# Patient Record
Sex: Female | Born: 1955 | Race: White | Hispanic: No | Marital: Married | Smoking: Never smoker
Health system: Southern US, Community
[De-identification: ages and names within clinical notes are randomized; demographics above are authoritative.]

## PROBLEM LIST (undated history)

## (undated) DIAGNOSIS — E7800 Pure hypercholesterolemia, unspecified: Secondary | ICD-10-CM

## (undated) DIAGNOSIS — G4730 Sleep apnea, unspecified: Secondary | ICD-10-CM

## (undated) DIAGNOSIS — I10 Essential (primary) hypertension: Secondary | ICD-10-CM

## (undated) DIAGNOSIS — E119 Type 2 diabetes mellitus without complications: Secondary | ICD-10-CM

## (undated) DIAGNOSIS — K219 Gastro-esophageal reflux disease without esophagitis: Secondary | ICD-10-CM

## (undated) DIAGNOSIS — N946 Dysmenorrhea, unspecified: Secondary | ICD-10-CM

## (undated) DIAGNOSIS — M2550 Pain in unspecified joint: Secondary | ICD-10-CM

## (undated) DIAGNOSIS — A630 Anogenital (venereal) warts: Secondary | ICD-10-CM

## (undated) DIAGNOSIS — M1990 Unspecified osteoarthritis, unspecified site: Secondary | ICD-10-CM

## (undated) DIAGNOSIS — E079 Disorder of thyroid, unspecified: Secondary | ICD-10-CM

## (undated) DIAGNOSIS — Z8781 Personal history of (healed) traumatic fracture: Secondary | ICD-10-CM

## (undated) DIAGNOSIS — F419 Anxiety disorder, unspecified: Secondary | ICD-10-CM

## (undated) DIAGNOSIS — C801 Malignant (primary) neoplasm, unspecified: Secondary | ICD-10-CM

## (undated) DIAGNOSIS — F329 Major depressive disorder, single episode, unspecified: Secondary | ICD-10-CM

## (undated) DIAGNOSIS — R32 Unspecified urinary incontinence: Secondary | ICD-10-CM

## (undated) DIAGNOSIS — R87619 Unspecified abnormal cytological findings in specimens from cervix uteri: Secondary | ICD-10-CM

## (undated) DIAGNOSIS — F32A Depression, unspecified: Secondary | ICD-10-CM

## (undated) DIAGNOSIS — D649 Anemia, unspecified: Secondary | ICD-10-CM

## (undated) DIAGNOSIS — R7989 Other specified abnormal findings of blood chemistry: Secondary | ICD-10-CM

## (undated) HISTORY — PX: COLON RESECTION: SHX5231

## (undated) HISTORY — DX: Pure hypercholesterolemia, unspecified: E78.00

## (undated) HISTORY — PX: CARPAL TUNNEL RELEASE: SHX101

## (undated) HISTORY — DX: Type 2 diabetes mellitus without complications: E11.9

## (undated) HISTORY — DX: Unspecified abnormal cytological findings in specimens from cervix uteri: R87.619

## (undated) HISTORY — DX: Disorder of thyroid, unspecified: E07.9

## (undated) HISTORY — DX: Unspecified urinary incontinence: R32

## (undated) HISTORY — DX: Anogenital (venereal) warts: A63.0

## (undated) HISTORY — DX: Major depressive disorder, single episode, unspecified: F32.9

## (undated) HISTORY — PX: FOOT SURGERY: SHX648

## (undated) HISTORY — DX: Anxiety disorder, unspecified: F41.9

## (undated) HISTORY — DX: Malignant (primary) neoplasm, unspecified: C80.1

## (undated) HISTORY — DX: Essential (primary) hypertension: I10

## (undated) HISTORY — DX: Dysmenorrhea, unspecified: N94.6

## (undated) HISTORY — DX: Personal history of (healed) traumatic fracture: Z87.81

## (undated) HISTORY — DX: Anemia, unspecified: D64.9

## (undated) HISTORY — PX: LASIK: SHX215

## (undated) HISTORY — PX: OTHER SURGICAL HISTORY: SHX169

## (undated) HISTORY — DX: Gastro-esophageal reflux disease without esophagitis: K21.9

## (undated) HISTORY — DX: Depression, unspecified: F32.A

## (undated) HISTORY — PX: SHOULDER SURGERY: SHX246

## (undated) HISTORY — PX: BUNIONECTOMY: SHX129

## (undated) HISTORY — DX: Other specified abnormal findings of blood chemistry: R79.89

## (undated) HISTORY — DX: Sleep apnea, unspecified: G47.30

## (undated) HISTORY — DX: Pain in unspecified joint: M25.50

## (undated) HISTORY — PX: TUBAL LIGATION: SHX77

## (undated) HISTORY — DX: Unspecified osteoarthritis, unspecified site: M19.90

---
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
## 1988-04-17 HISTORY — PX: APPENDECTOMY: SHX54

## 1998-02-03 ENCOUNTER — Ambulatory Visit (HOSPITAL_COMMUNITY): Admission: RE | Admit: 1998-02-03 | Discharge: 1998-02-03 | Payer: Self-pay | Admitting: Family Medicine

## 2000-04-25 ENCOUNTER — Ambulatory Visit (HOSPITAL_COMMUNITY): Admission: RE | Admit: 2000-04-25 | Discharge: 2000-04-25 | Payer: Self-pay | Admitting: Family Medicine

## 2000-04-25 ENCOUNTER — Encounter: Payer: Self-pay | Admitting: Family Medicine

## 2001-09-25 ENCOUNTER — Ambulatory Visit (HOSPITAL_COMMUNITY): Admission: RE | Admit: 2001-09-25 | Discharge: 2001-09-25 | Payer: Self-pay | Admitting: Family Medicine

## 2001-09-25 ENCOUNTER — Encounter: Payer: Self-pay | Admitting: Family Medicine

## 2003-02-06 ENCOUNTER — Encounter (INDEPENDENT_AMBULATORY_CARE_PROVIDER_SITE_OTHER): Payer: Self-pay | Admitting: *Deleted

## 2003-02-06 ENCOUNTER — Ambulatory Visit (HOSPITAL_BASED_OUTPATIENT_CLINIC_OR_DEPARTMENT_OTHER): Admission: RE | Admit: 2003-02-06 | Discharge: 2003-02-06 | Payer: Self-pay | Admitting: Plastic Surgery

## 2003-02-06 ENCOUNTER — Ambulatory Visit (HOSPITAL_COMMUNITY): Admission: RE | Admit: 2003-02-06 | Discharge: 2003-02-06 | Payer: Self-pay | Admitting: Plastic Surgery

## 2003-05-11 ENCOUNTER — Ambulatory Visit (HOSPITAL_COMMUNITY): Admission: RE | Admit: 2003-05-11 | Discharge: 2003-05-11 | Payer: Self-pay | Admitting: Family Medicine

## 2003-05-22 ENCOUNTER — Ambulatory Visit (HOSPITAL_COMMUNITY): Admission: RE | Admit: 2003-05-22 | Discharge: 2003-05-22 | Payer: Self-pay | Admitting: Family Medicine

## 2003-11-30 ENCOUNTER — Other Ambulatory Visit: Admission: RE | Admit: 2003-11-30 | Discharge: 2003-11-30 | Payer: Self-pay | Admitting: Family Medicine

## 2003-12-15 ENCOUNTER — Ambulatory Visit (HOSPITAL_COMMUNITY): Admission: RE | Admit: 2003-12-15 | Discharge: 2003-12-15 | Payer: Self-pay | Admitting: Family Medicine

## 2003-12-24 ENCOUNTER — Encounter: Payer: Self-pay | Admitting: Pulmonary Disease

## 2003-12-24 ENCOUNTER — Ambulatory Visit (HOSPITAL_BASED_OUTPATIENT_CLINIC_OR_DEPARTMENT_OTHER): Admission: RE | Admit: 2003-12-24 | Discharge: 2003-12-24 | Payer: Self-pay | Admitting: Family Medicine

## 2004-02-02 ENCOUNTER — Encounter: Payer: Self-pay | Admitting: Pulmonary Disease

## 2004-05-09 ENCOUNTER — Ambulatory Visit: Payer: Self-pay | Admitting: Pulmonary Disease

## 2004-12-11 ENCOUNTER — Inpatient Hospital Stay (HOSPITAL_COMMUNITY): Admission: EM | Admit: 2004-12-11 | Discharge: 2004-12-16 | Payer: Self-pay | Admitting: *Deleted

## 2005-01-13 ENCOUNTER — Ambulatory Visit (HOSPITAL_COMMUNITY): Admission: RE | Admit: 2005-01-13 | Discharge: 2005-01-13 | Payer: Self-pay | Admitting: Family Medicine

## 2005-01-16 ENCOUNTER — Other Ambulatory Visit: Admission: RE | Admit: 2005-01-16 | Discharge: 2005-01-16 | Payer: Self-pay | Admitting: Family Medicine

## 2005-02-24 ENCOUNTER — Ambulatory Visit (HOSPITAL_COMMUNITY): Admission: RE | Admit: 2005-02-24 | Discharge: 2005-02-24 | Payer: Self-pay | Admitting: Family Medicine

## 2005-04-04 ENCOUNTER — Encounter: Admission: RE | Admit: 2005-04-04 | Discharge: 2005-04-04 | Payer: Self-pay | Admitting: Otolaryngology

## 2005-05-25 ENCOUNTER — Encounter (INDEPENDENT_AMBULATORY_CARE_PROVIDER_SITE_OTHER): Payer: Self-pay | Admitting: Specialist

## 2005-05-25 ENCOUNTER — Inpatient Hospital Stay (HOSPITAL_COMMUNITY): Admission: RE | Admit: 2005-05-25 | Discharge: 2005-06-01 | Payer: Self-pay | Admitting: Surgery

## 2006-01-24 ENCOUNTER — Ambulatory Visit: Payer: Self-pay | Admitting: Pulmonary Disease

## 2006-04-26 ENCOUNTER — Other Ambulatory Visit: Admission: RE | Admit: 2006-04-26 | Discharge: 2006-04-26 | Payer: Self-pay | Admitting: Family Medicine

## 2006-05-07 ENCOUNTER — Ambulatory Visit (HOSPITAL_COMMUNITY): Admission: RE | Admit: 2006-05-07 | Discharge: 2006-05-07 | Payer: Self-pay | Admitting: Family Medicine

## 2006-08-03 ENCOUNTER — Ambulatory Visit (HOSPITAL_COMMUNITY): Admission: RE | Admit: 2006-08-03 | Discharge: 2006-08-04 | Payer: Self-pay | Admitting: Specialist

## 2007-05-24 ENCOUNTER — Ambulatory Visit (HOSPITAL_COMMUNITY): Admission: RE | Admit: 2007-05-24 | Discharge: 2007-05-24 | Payer: Self-pay | Admitting: Family Medicine

## 2007-05-29 ENCOUNTER — Other Ambulatory Visit: Admission: RE | Admit: 2007-05-29 | Discharge: 2007-05-29 | Payer: Self-pay | Admitting: Family Medicine

## 2008-06-29 ENCOUNTER — Ambulatory Visit (HOSPITAL_COMMUNITY): Admission: RE | Admit: 2008-06-29 | Discharge: 2008-06-29 | Payer: Self-pay | Admitting: Family Medicine

## 2009-08-09 ENCOUNTER — Encounter: Payer: Self-pay | Admitting: Pulmonary Disease

## 2009-09-28 ENCOUNTER — Other Ambulatory Visit: Admission: RE | Admit: 2009-09-28 | Discharge: 2009-09-28 | Payer: Self-pay | Admitting: Family Medicine

## 2009-10-11 ENCOUNTER — Encounter: Payer: Self-pay | Admitting: Pulmonary Disease

## 2009-11-03 DIAGNOSIS — E039 Hypothyroidism, unspecified: Secondary | ICD-10-CM | POA: Insufficient documentation

## 2009-11-03 DIAGNOSIS — G4733 Obstructive sleep apnea (adult) (pediatric): Secondary | ICD-10-CM | POA: Insufficient documentation

## 2009-11-04 ENCOUNTER — Ambulatory Visit: Payer: Self-pay | Admitting: Pulmonary Disease

## 2009-11-04 DIAGNOSIS — E785 Hyperlipidemia, unspecified: Secondary | ICD-10-CM | POA: Insufficient documentation

## 2009-11-04 DIAGNOSIS — I10 Essential (primary) hypertension: Secondary | ICD-10-CM | POA: Insufficient documentation

## 2009-12-15 ENCOUNTER — Telehealth: Payer: Self-pay | Admitting: Pulmonary Disease

## 2009-12-17 ENCOUNTER — Encounter: Payer: Self-pay | Admitting: Pulmonary Disease

## 2010-01-27 ENCOUNTER — Ambulatory Visit (HOSPITAL_BASED_OUTPATIENT_CLINIC_OR_DEPARTMENT_OTHER): Admission: RE | Admit: 2010-01-27 | Discharge: 2010-01-27 | Payer: Self-pay | Admitting: Plastic Surgery

## 2010-03-02 ENCOUNTER — Ambulatory Visit (HOSPITAL_COMMUNITY): Admission: RE | Admit: 2010-03-02 | Discharge: 2010-03-02 | Payer: Self-pay | Admitting: Family Medicine

## 2010-05-08 ENCOUNTER — Encounter: Payer: Self-pay | Admitting: Family Medicine

## 2010-05-19 NOTE — Consult Note (Signed)
Summary: [facility]   Imported By: Sherian Rein 11/05/2009 07:46:45  _____________________________________________________________________  External Attachment:    Type:   Image     Comment:   External Document

## 2010-05-19 NOTE — Miscellaneous (Signed)
Summary: auto shows optimal pressure of 17cm  Clinical Lists Changes  Orders: Added new Referral order of DME Referral (DME) - Signed auto shows great compliance, no leaks, and optimal pressure 17cm will send an order to dme

## 2010-05-19 NOTE — Progress Notes (Signed)
Summary: waiting for order  Phone Note Call from Patient Call back at Home Phone [phone]   Caller: Patient Call For: Johannes Everage Summary of Call: pt says that [facility] is waiting for an order. she doesn't know what for, but it has something to do with cpap. pt has already done the 2 wk auto.  Initial call taken by: Tivis Ringer, CNA,  December 15, 2009 4:04 PM  Follow-up for Phone Call        megan see if the download in in the folder Follow-up by: Oneita Jolly,  December 15, 2009 4:26 PM  Additional Follow-up for Phone Call Additional follow up Details #1::        received down load and CMN from [facility] and put in Kc's very important look at folder for him to review.  Aundra Millet Reynolds LPN  December 16, 2009 4:57 PM     Additional Follow-up for Phone Call Additional follow up Details #2::    see clinical list update Follow-up by: Barbaraann Share MD,  December 17, 2009 11:07 AM

## 2010-05-19 NOTE — Letter (Signed)
Summary: Denial of LMN/[facility]  Denial of LMN/[facility]   Imported By: Lester Mamou 08/13/2009 09:35:07  _____________________________________________________________________  External Attachment:    Type:   Image     Comment:   External Document

## 2010-05-19 NOTE — Letter (Signed)
Summary: DENIED CMN/[facility]  DENIED CMN/[facility]   Imported By: Lester Mamou 10/15/2009 09:34:56  _____________________________________________________________________  External Attachment:    Type:   Image     Comment:   External Document

## 2010-05-19 NOTE — Assessment & Plan Note (Signed)
Summary: consult for management of osa   Primary Provider/Referring Provider:  Nicholos Johns  CC:  Sleep Consult. Former pt.  Last seen by Dr. Shelle Iron Oct 2007.Marland Kitchen  History of Present Illness: the pt is a 55 y/o female who I have been asked to see for management of osa.  She was diagnosed with mild osa in 2005, with AHI of 11/hr, along with large numbers of PLMS with significant sleep disruption.  She was tried on a dopamine agonist first, and had issues with tolerance.  She was then started on cpap, with optimal pressure 14cm, and did well with this therapy.  She was last seen in 2007.  She comes in today where she is not sleeping as well as she has in the past.  She is wearing cpap compliantly, and has been keeping up with the mask changes.  She currently is using a full face mask.  She goes to bed btw 9-10pm, and arises at 4am to start her day.  She has very restless sleep, and does not feel rested in the am's upon arising.  She does not think she is kicking at night.  Her alertness typically declines as the day goes on, and she will need to take a nap in the late afternoon at times.  She can doze in the evening while watching tv.  Her weight is neutral from the last evaluation, and her epworth score today is 8.  Current Medications (verified): 1)  Zoloft 100 Mg Tabs (Sertraline Hcl) .... Take 1 Tablet By Mouth Once A Day 2)  Omeprazole 20 Mg Cpdr (Omeprazole) .... Take 1 Tablet By Mouth Once A Day 3)  Lisinopril 20 Mg Tabs (Lisinopril) .... Take 1 Tablet By Mouth Once A Day 4)  Pravastatin Sodium 40 Mg Tabs (Pravastatin Sodium) .... Take 1 Tablet By Mouth Two Times A Day  Allergies (verified): No Known Drug Allergies  Past History:  Past Medical History:  HYPERTENSION (ICD-401.9) HYPERLIPIDEMIA (ICD-272.4) OBSTRUCTIVE SLEEP APNEA (ICD-327.23)--AHI 11/hr HYPOTHYROIDISM (ICD-244.9) PLMS on npsg....4/hr with arousal or awakening.  Past Surgical History: appendectomy  1990 tubal ligation   1988 nasal septal reconstruction colon surgery 2006  Family History: Reviewed history and no changes required. heart disease: mother   Social History: Reviewed history from 11/03/2009 and no changes required. Pt is an Environmental health practitioner for [employer].   Pt is married. Pt has children. Pt has never smoked.   Review of Systems       The patient complains of shortness of breath with activity and depression.  The patient denies shortness of breath at rest, productive cough, non-productive cough, coughing up blood, chest pain, irregular heartbeats, acid heartburn, indigestion, loss of appetite, weight change, abdominal pain, difficulty swallowing, sore throat, tooth/dental problems, headaches, nasal congestion/difficulty breathing through nose, sneezing, itching, ear ache, anxiety, hand/feet swelling, joint stiffness or pain, rash, change in color of mucus, and fever.    Vital Signs:  Patient profile:   55 year old female Height:      63 inches Weight:      187.50 pounds BMI:     33.33 O2 Sat:      95 % on Room air Temp:     97.3 degrees F oral Pulse rate:   75 / minute BP sitting:   118 / 70  (left arm) Cuff size:   regular  Vitals Entered By: Arman Filter LPN (November 04, 2009 2:07 PM)  O2 Flow:  Room air CC: Sleep Consult. Former pt.  Last seen  by Dr. Shelle Iron Oct 2007. Comments Medications reviewed with patient Arman Filter LPN  November 04, 2009 2:07 PM    Physical Exam  General:  ow female in nad Eyes:  PERRLA and EOMI.   Nose:  deviated septum to right with narrowing.  Left patent. Mouth:  elongation of soft palate and uvula Neck:  no jvd, tmg, LN Lungs:  clear to auscultation Heart:  rrr, no mrg Abdomen:  soft and nontender, bs + Extremities:  no edema noted, pulses intact distally no cyanosis Neurologic:  alert and oriented, moves all 4.   Impression & Recommendations:  Problem # 1:  OBSTRUCTIVE SLEEP APNEA (ICD-327.23) the pt has known osa for  which she is wearing cpap compliantly.  Her download today verifies this.  Will have her machine checked for issues, and relook at pressure needs.  If she continues to have nonrestorative sleep and daytime sleepiness issues, may have to consider if her PLMS represent a primary movement disorder of sleep?  I have also encouraged her to work aggressively on weight loss.    Medications Added to Medication List This Visit: 1)  Omeprazole 20 Mg Cpdr (Omeprazole) .... Take 1 tablet by mouth once a day 2)  Lisinopril 20 Mg Tabs (Lisinopril) .... Take 1 tablet by mouth once a day 3)  Pravastatin Sodium 40 Mg Tabs (Pravastatin sodium) .... Take 1 tablet by mouth two times a day  Other Orders: New Patient Level IV (69629) DME Referral (DME)  Patient Instructions: 1)  will get [facility] to check your machine's functioning 2)  will use auto device for 2 weeks to re-optimize your pressure.  I will let you know the results. 3)  work on weight loss 4)  followup with me in one year, but call if having issues.  Appended Document: consult for management of osa Almyra Free, we had this pt put on an auto for 2 weeks with download to follow, but I am wondering if she is on an auto already.  Please find out for me...but dme still needs to check machine functioning.

## 2010-06-29 LAB — POCT I-STAT, CHEM 8
BUN: 12 mg/dL (ref 6–23)
Calcium, Ion: 1.23 mmol/L (ref 1.12–1.32)
Chloride: 101 mEq/L (ref 96–112)
Creatinine, Ser: 0.9 mg/dL (ref 0.4–1.2)
Glucose, Bld: 109 mg/dL — ABNORMAL HIGH (ref 70–99)
HCT: 41 % (ref 36.0–46.0)
Hemoglobin: 13.9 g/dL (ref 12.0–15.0)
Potassium: 4 mEq/L (ref 3.5–5.1)
Sodium: 136 mEq/L (ref 135–145)
TCO2: 27 mmol/L (ref 0–100)

## 2010-06-29 LAB — POCT HEMOGLOBIN-HEMACUE: Hemoglobin: 12.6 g/dL (ref 12.0–15.0)

## 2010-09-02 NOTE — Procedures (Signed)
NAME:  Mallory Carter, Mallory Carter              ACCOUNT NO.:  [id number]   MEDICAL RECORD NO.:  [id number]          PATIENT TYPE:  OUT   LOCATION:  SLEEP CENTER                 FACILITY:  [facility]   PHYSICIAN:  Clinton D. Maple Hudson, M.D. DATE OF BIRTH:  03-Jul-1955   DATE OF ADMISSION:  12/24/2003  DATE OF DISCHARGE:  12/24/2003                              NOCTURNAL POLYSOMNOGRAM   REFERRING PHYSICIAN:  Dr. Francesca Oman   INDICATION FOR STUDY:  Insomnia with sleep apnea.   EPWORTH SLEEPINESS SCORE:  12/24   BMI:  31.8.   WEIGHT:  180 pounds   MEDICATION LIST:  Zoloft, Synthroid, aspirin, multivitamin.   SLEEP ARCHITECTURE:  Total sleep time 388 minutes with sleep efficiency 97%.  Stage I was 4%, Stage II 65%, Stages III and IV were 2%, REM was 29% of  total sleep time. Sleep latency was 9.5 minutes, REM latency was 122  minutes, awake after sleep onset 3 minutes, arousal index 26.   RESPIRATORY DATA:  RDI 11/hr indicating mild obstructive sleep  apnea/hypopnea syndrome with 62 obstructive hypopneas and 6 obstructive  apneas.  Events were not positional. REM RDI 23/hr. Split study protocol could not be  performed because of insufficient events early enough to allow for CPAP  titration.  Most events clustered late in the night.   There were frequent nonspecific arousals.   OXYGEN DATA:  Moderate snoring, occasionally loud with mild oxygen  desaturation to 87%. Mean oxygen saturation on room air through the study  was 95%.   CARDIAC DATA:  Normal sinus rhythm.   MOVEMENT/PARASOMNIA:  A total of 100 limb jerks were recorded of which 26  were associated with arousal or awakening for a periodic limb movement with  arousal index of 4/hr.  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  Additional movements were noted during REM.  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  Is there a  family history of Parkinson's disease?   IMPRESSION/RECOMMENDATION:  Mild obstructive sleep apnea/hypopnea syndrome,  respiratory disturbance index 11/hr with mild oxygen desaturation.  Consider  return for CPAP titration or evaluation for alternative therapies if  appropriate.  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  This might be  treated with clonazepam or Requip if appropriate.  Occasional limb jerks  noted during REM, which is abnormal, but of uncertain clinical significance  in this case.  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   Frequent nonspecific EEG arousals noted early in the study may contribute to  subjective poor sleep quality/insomnia complaints and might justify  including treatment for insomnia in the clinical plan.                                   ______________________________                                Rennis Chris. Maple Hudson, M.D.  Diplomate, Biomedical engineer of Sleep Medicine    CDY/MEDQ  D:  12/26/2003 10:56:45  T:  12/26/2003 18:22:56  Job:  [id number]

## 2010-09-02 NOTE — Discharge Summary (Signed)
NAMEANEYAH, LORTZ                ACCOUNT NO.:  [id number]   MEDICAL RECORD NO.:  [id number]          PATIENT TYPE:  INP   LOCATION:  5727                         FACILITY:  [facility]   PHYSICIAN:  Kela Millin, M.D.DATE OF BIRTH:  02-23-1956   DATE OF ADMISSION:  12/11/2004  DATE OF DISCHARGE:  12/16/2004                                 DISCHARGE SUMMARY   DISCHARGE DIAGNOSES:  1.  Sigmoid diverticulitis.  2.  History of depression.  3  History of hypothyroidism, no medications.   CONSULTATIONS:  Surgery.  Dr. Ezzard Standing.   STUDIES:  CT scan of the abdomen and pelvis:  Findings consistent with acute  sigmoid diverticulitis.  May be a contained perforation, but no visible  abscess.   HISTORY:  The patient is a 55 year old white female who presented with  complaints of nausea, anorexia and severe left lower quadrant cramping  abdominal pain.  She reported that she initially attributed the pain to gas  and took Ex-Lax, which precipitated diarrhea but did not relieve her pain.  She stated that the pain persisted over the next 48 hours and so she came to  the ER.  She denied fevers, also denied chills and no vomiting.  She stated  that she had passed flatus and had had loose stools but denied hematemesis  and no melena.  The patient was seen in the ER and a CT scan of the abdomen  showed evidence of sigmoid diverticulitis, and she was admitted to the [facility]  hospitalist service for further evaluation and management.   Her physical exam upon admission as per Dr. Tresa Endo revealed a temperature  of 97.2 with a blood pressure of 154/85, pulse of 88, respiratory rate 12,  O2 saturation of 98%, and the pertinent findings on exam were on her  abdomen, she had tenderness to palpation in the left lower quadrant without  guarding or rebound, and slight distension was noted.  The rest of her  physical exam was noted to be within normal limits, and other laboratory  data included a sodium of 136,  with a potassium of 4.3, glucose 118,  creatinine 0.6.  Urinalysis negative.  Her white cell count 11, hemoglobin  of 13.2.   HOSPITAL COURSE:  Problem 1.  SIGMOID DIVERTICULITIS:  Upon admission, the patient was started  on empiric antibiotics, Cipro and Flagyl, and also IV analgesics for pain  control.  Surgery was also consulted, and Dr. Ezzard Standing saw the patient and  agreed with conservative management.  She was kept n.p.o. and hydrated with  IV fluids.  Her abdominal pain gradually resolved and she was subsequently  started on a clear liquid diet, which she tolerated.  Today she was seen by  surgery, Dr. Ezzard Standing, and her diet advanced to low-fiber, low-residual diet.  She has tolerated all her meals today with no nausea and vomiting and no  abdominal pain and is requesting to go home at this time.  Dr. Ezzard Standing  recommended following up with him in six weeks.  The patient's last white  cell count is 6.9.  She has remained afebrile  and hemodynamically stable,  and so I think that it is reasonable to discharge her at this time to follow  up with her primary care physician and Dr. Ezzard Standing.   Problem 2.  HISTORY OF DEPRESSION:  Patient to continue on Zoloft.   DISCHARGE MEDICATIONS:  1.  Cipro 500 mg one p.o. b.i.d. x9 more days.  2.  Flagyl 500 mg one p.o. t.i.d. x9  more days.  3.  Vicodin one to two p.o. q.4-6h. p.r.n.  4.  Patient to continue preadmission medications.   FOLLOW-UP CARE:  1.  Dr. Ezzard Standing in six weeks as scheduled.  2.  Primary care physician, Dr. Nicholos Johns, in one week.   DISCHARGE CONDITION:  Improved/stable.   DISCHARGE DIET:  Low-fiber/low-residual diet, no nuts.  Patient encouraged  to gradually increase fiber intake and diet after her symptoms are  completely resolved.           ______________________________  Kela Millin, M.D.     ACV/MEDQ  D:  12/16/2004  T:  12/17/2004  Job:  [id number]   cc:   Molly Maduro A. Nicholos Johns, M.D.  Fax: [phone]   Sandria Bales. Ezzard Standing,  M.D.  [address], [address]  [place]  [place] [address]

## 2010-09-02 NOTE — Discharge Summary (Signed)
NAMEBERYL, HORNBERGER NO.:  [id number]   MEDICAL RECORD NO.:  [id number]          PATIENT TYPE:  INP   LOCATION:  5713                         FACILITY:  [facility]   PHYSICIAN:  Sandria Bales. Ezzard Standing, M.D.  DATE OF BIRTH:  [birth year]   DATE OF ADMISSION:  05/25/2005  DATE OF DISCHARGE:  06/01/2005                                 DISCHARGE SUMMARY   DISCHARGE DIAGNOSES:  1.  Sigmoid colon diverticulitis.  2.  History of depression.  3.  Corrected hypothyroidism.   OPERATIONS PERFORMED:  Patient had a laparoscopic-assisted sigmoid colectomy  on May 25, 2005.   HISTORY OF ILLNESS:  Ms. Hiltunen is a 55 year old white female who is a  patient of Dr. Elias Else who was hospitalized at [facility] at  the end of August 2006 for diverticulitis.  She responded well to  antibiotics, but since going home has had repeated episodes of  diverticulitis requiring antibiotics.  She has now come for this  hospitalization for sigmoid colectomy to treat her recurrent diverticulitis.   The patient also has a history of depression for which she is on Zoloft and  she has history, I think, of hypothyroidism.   The patient had a mechanical and antibiotic bowel prep the day before  surgery and then presented to [facility] on February 8 where she  underwent a laparoscopic-assisted sigmoid colectomy.   Postoperative she did well.  On her first postoperative day she did have an  NG tube.  Her hemoglobin was 12, her white blood count 13,900.  Her sodium  132, potassium 3.8, chloride of 0.8.   By the second postoperative day I removed her NG tube.  I started her on  clear liquids on the sixth postoperative day after having a couple of small  bowel movements.  She is now seven days postoperative.  She is afebrile.  She is taking a regular diet.  Her wound looks good and she is ready for  discharge.   DISCHARGE INSTRUCTIONS:  Vicodin for pain.  She will resume her Zoloft that  she was on at home 100 mg daily, Zantac 100 mg daily, multivitamin.  We will  remove her staples at the time of discharge and Steri-Strip her wound and  she will see me back in the office in two to three weeks.   Her final pathology on the sigmoid colon showed segment of sigmoid colon  with diverticulitis and peridiverticulitis and four benign lymph nodes.      Sandria Bales. Ezzard Standing, M.D.  Electronically Signed     DHN/MEDQ  D:  06/01/2005  T:  06/01/2005  Job:  [id number]   cc:   Molly Maduro A. Nicholos Johns, M.D.  Fax: [phone]

## 2010-09-02 NOTE — Consult Note (Signed)
NAMEHALLEY, SHEPHEARD NO.:  [id number]   MEDICAL RECORD NO.:  [id number]          PATIENT TYPE:  INP   LOCATION:  5727                         FACILITY:  [facility]   PHYSICIAN:  Sandria Bales. Ezzard Standing, M.D.  DATE OF BIRTH:  1956-03-18   DATE OF CONSULTATION:  12/12/2004  DATE OF DISCHARGE:                                   CONSULTATION   REASON FOR CONSULTATION:  Diverticulitis.   Mallory Carter is a 55 year old female who complains of lower abdominal pain  beginning yesterday afternoon.  This was associated with nausea as well as  abdominal cramping.  She denies any fevers, chills, shortness of breath, or  chest pain.  Review of systems is otherwise negative.  She presented to the  emergency room where CT of the abdomen/pelvis confirmed an acute sigmoid  diverticulitis with no abscess.  She was admitted and kept n.p.o., IV fluid  hydration, IV Cipro, IV Flagyl as well as IV pain control.   ALLERGIES:  ERYTHROMYCIN.   MEDICATIONS:  IV Cipro, IV Flagyl, morphine for pain.   SOCIAL HISTORY:  She is married, lives in [place].  She has two  daughters.  She did have a third child but this child died secondary to an  MVA.  She denies any tobacco, alcohol, or illicit drug use.   FAMILY HISTORY:  Mom died with heart and lung disease.  Father is alive and  well.   PAST MEDICAL HISTORY:  1.  Depression.  2.  She also has a history of hypothyroidism but she is not on medical      therapy for at this time.  3.  She has had multiple surgeries including appendectomy, foot surgery,      sinus surgery, and shoulder surgery.   LABORATORY STUDIES:  On admission her white count was 11,100.  It has  climbed to 15,600.  Hemoglobin 11.6, hematocrit 32.8.  BMET is essentially  normal.  Her LFTs are normal.  Amylase and lipase are unremarkable.  TSH  2.324.  Urinalysis negative.   PHYSICAL EXAMINATION:  VITAL SIGNS:  Temperature 99.9, pulse 100,  respirations 20, blood pressure 123/80,  O2 saturations 97% on room air.  HEENT:  Grossly normal.  No carotid or subclavian bruits.  No JVD or  thyromegaly.  Sclerae clear.  Conjunctivae normal.  Nares without discharge.  No jaundice.  CHEST:  Clear to auscultation bilaterally.  No wheezing or rhonchi.  HEART:  Regular rate and rhythm.  No gallops, murmurs, rubs, or ectopy.  ABDOMEN:  Bilateral lower quadrants are tender.  There is no guarding.  There is good bowel sounds.  NEUROLOGIC:  Cranial nerves II-XII grossly intact.  SKIN:  Warm and dry.  EXTREMITIES:  Lower extremities reveal no peripheral edema.  Warm and well  perfused.   ASSESSMENT/PLAN:  1.  Sigmoid diverticulitis.  No evidence of perforation or abscess by CT      scan.  The diverticulitis is somewhat more distal than usual      diverticulitis.  2.  History of depression.  3.  Hypothyroidism.  4.  History of  appendectomy.  5.  History of foot surgery.  6.  History of sinus surgery.  7.  History of shoulder surgery.  8.  Allergy to erythromycin.   At this point we will continue the patient's current medical regimen  including IV Cipro, IV Flagyl, pain control, IV fluids, and keeping her  n.p.o.  Over the next several days we hope that she will continue to  improve.  This seems like the first diverticulitis attack that she has had.  When this episode has resolved, it would be appropriate to obtain a BE in 8  to 12 weeks to document the amount of diverticular disease she has.  If she  does not progress or worsens we may need to contemplate surgery sooner,  though at this point we hope a nonsurgical course will remain our plan.      Mallory Carter, P.A.      Sandria Bales. Ezzard Standing, M.D.  Electronically Signed    LB/MEDQ  D:  12/12/2004  T:  12/12/2004  Job:  [id number]   cc:   Sherin Quarry, MD   Meredith Staggers, M.D.  [address]  [place]  [place] [address]  Fax: [phone]

## 2010-09-02 NOTE — Op Note (Signed)
Mallory Carter, DAVILLA NO.:  [id number]   MEDICAL RECORD NO.:  [id number]          PATIENT TYPE:  OIB   LOCATION:  1513                         FACILITY:  [facility]   PHYSICIAN:  Erasmo Leventhal, M.D.DATE OF BIRTH:  1956/03/05   DATE OF PROCEDURE:  08/03/2006  DATE OF DISCHARGE:                               OPERATIVE REPORT   PREOPERATIVE DIAGNOSIS:  Right shoulder calcific tendinitis.   POSTOPERATIVE DIAGNOSIS:  Right shoulder calcific tendinitis.   PROCEDURE:  1. Right shoulder exam under anesthesia.  2. Arthroscopic subacromial decompression.  3. Acromioplasty.  4. Bursectomy.  5. __________ ligament release.  6. Arthroscopic debridement of calcific tendinitis.  7. Glenohumeral diagnostic arthroscopy.   SURGEON:  Eugenia Mcalpine, M.D.   ASSISTANT:  Vertell Novak, P.A.C.   ANESTHESIA:  1. Interscalene block.  2. General.   ESTIMATED BLOOD LOSS:  Less than 10 mL.   DRAINS:  None.   COMPLICATIONS:  None.   DISPOSITION:  PACU stable.   OPERATIVE DETAILS:  Patient and family counseled in the holding area.  An IV was started and chart was signed appropriately.  Interscalene  block was administered.  Taken to the operating room, placed in the  supine position, given general anesthesia and turned into a left lateral  decubitus position, properly padded and bumped, right shoulder being  examined, full range of motion stable, prepped with DuraPrep, draped in  a sterile fashion, over head shoulder position utilizing 30 degrees of  abduction, 10 degrees of forward flexion and 10 pounds longitudinal  traction.  A posterior portal was created into a small stab wound.  An  arthroscope was gently placed into the glenohumeral joint.  Diagnostic  arthroscopy revealed normal synovium, capsule, biceps, labrum, articular  cartilage, glenohumeral ligaments with normal interarticular anatomy.  __________ .   Subacromial region revealed a thick subacromial  bursa, a lateral port  was established, a subacromial bursectomy was performed.  She had a mild  type 2 acromion, an ArthroCare system was utilized to release the  periosteum __________ ligament and scarring from the previous time.  A  bur was then placed posteriorly, the anterior-inferior acromioplasty was  performed, converting it to a flat acromial morphology, AC joint was  left alone and not violated.  At this point in time with needle  localization the calcific deposits could be localized in the anterior  supraspinatus and upper subscapularis.  Based upon that, this was then  unroofed and debrided.  I removed as much as possible, trying not to  destabilize the tendon.  There were some calcific deposits imbedded into  tendon itself which would require basically excising the tendon.  I felt  this was inappropriate.  This was well debrided back, copiously  irrigated to remove the calcific debris.  We had a well decompressed  calcific area with nice sturdy rotator cuff tissue available without any  significant rent.  I again irrigated, reinspected, no other  abnormalities noted and arthroscopic clips removed.  The two portals  were closed with one suture.  Another 15 mL of 12% Marcaine with  epinephrine placed to  the portal site and subacromial region for pain.  We confirmed this with Anesthesiology.  She was taken out of traction.  She had normal pulse in hand at the end of the case.  She was placed  into a sling, turned supine awake and taken from the operating room to  the PACU in stable condition.  Sponge and needle count correct, no  complications or problems.   To help with __________ , Mr. Halford Decamp, P.A.-C., assistance was  needed   PLAN:  She will be kept over night due to her sleep apnea, monitored  closely.  She will be discharged home tomorrow.  She will be followed  back in the office next week for x-rays and start physiotherapy.            ______________________________  Erasmo Leventhal, M.D.     RAC/MEDQ  D:  08/03/2006  T:  08/03/2006  Job:  [id number]

## 2010-09-02 NOTE — H&P (Signed)
NAMECHERYLEE, RAWLINSON NO.:  [id number]   MEDICAL RECORD NO.:  [id number]          PATIENT TYPE:  EMS   LOCATION:  MAJO                         FACILITY:  [facility]   PHYSICIAN:  Sherin Quarry, MD      DATE OF BIRTH:  [birth year]   DATE OF ADMISSION:  12/11/2004  DATE OF DISCHARGE:                                HISTORY & PHYSICAL   PRIMARY CARE PHYSICIAN:  Meredith Staggers, M.D., [facility].   Mallory Carter is a 55 year old lady who was in good health until Thursday of  this week when she noted the onset of nausea, anorexia, and severe left  lower quadrant cramping abdominal pain. She initially attributed the pain to  gas and took Ex-Lax which caused her to have diarrhea but did not relieve  the pain.  The pain persisted over the next 48 hours and today; i.e.,  Sunday,  the pain has been worse. She states that she does not think that  she has had any fever. There is been no chills or sweats. No breathing  difficulty, no vomiting. She has passed flatus. Stools have been loose.  There has been no hematemesis or melena. She has not had any previous  examinations of her colon. She presented to the [facility] Emergency Room  where her temperature was 97.2. She was sent for a CT scan of the abdomen  which shows evidence of sigmoid diverticulitis. She is, therefore, admitted  at this time for further treatment of this problem.   PAST MEDICAL HISTORY:   MEDICATIONS:  Consist of Zoloft 100 mg daily and vitamins.   ALLERGIES:  She is allergic to ERYTHROMYCIN.   OPERATIONS:  1.  She has had two previous C-sections.  2.  Appendectomy in 1990.  3.  A sinus operation.  4.  Shoulder operation.  5.  Foot operation.   MEDICAL ILLNESSES:  1.  The patient was treated for pneumonia last year approximately in      December. This was managed as an outpatient with oral antibiotics. The      patient recovered uneventfully.  2.  The patient is currently receiving  treatment for depression with Zoloft.  3.  The patient was diagnosed with hypothyroidism 1 year ago. She was taking      Synthroid consistently until 1 month ago when she spontaneously stopped      taking this medication. She is not sure why she did this. She has not      had any recent problems with heat or cold intolerance, skin or hair      changes.   FAMILY HISTORY:  She has three siblings who are in good health; 1 sibling  died in a motor vehicle accident. Her mother died of heart and lung problems  in 74. Her father is said to be in excellent health.   SOCIAL HISTORY:  She does not smoke. She denies abuse of alcohol. She is  accompanied by her family. She states that she does not take any herbal  medicines or over-the-counter medications except for an occasional aspirin  REVIEW OF SYSTEMS:  HEAD:  She denies headache or dizziness. EYES:  She  denies visual blurring or diplopia. NOSE AND THROAT: Denies earache, sinus  pain or sore throat. CHEST:  Denies coughing, wheezing, or chest congestion.  CARDIOVASCULAR:  Denies orthopnea, PND or ankle edema. GI:  See above. GU:  Denies dysuria, urinary frequency, hesitancy, or nocturia. There is been no  vaginal bleeding or discharge NEUROLOGIC:  There is no history of seizure or  stroke. ENDOCRINE: Denies excessive thirst, urinary frequency or nocturia.   PHYSICAL EXAMINATION:  VITAL SIGNS: Temperature is 97.2, blood pressure is  154/85, pulse is 88, respirations were 12, O2 saturation 99%.  HEENT:  Exam is within normal limits.  CHEST: The chest is clear to auscultation and percussion.  CARDIOVASCULAR:  Reveals normal S1 and S2. There are no rubs, murmurs or  gallops. The abdomen is remarkable for somewhat hypoactive bowel sounds.  They appear to be normal in tone.  ABDOMEN: There is tenderness to palpation in the left lower quadrant without  guarding or rebound. The abdomen seems slightly distended.  NEUROLOGIC:  Testing is normal   EXTREMITIES:  Examination of the extremities is normal.   As previously mentioned, the CT scan of the abdomen is interpreted as  showing signs of sigmoid diverticulitis.   Other laboratory studies include sodium 136, potassium 4.3 and glucose is  118, creatinine is 0.6. A urinalysis is negative.  CBC reveals white count  11,100, hemoglobin 13.2   IMPRESSION:  1.  Left lower quadrant pain x2 days, probable sigmoid diverticulitis.  2.  History of depression.  3.  History of hypothyroidism. The patient has discontinued medication.  4.  History of appendicitis.  5.  History of foot, sinus and shoulder operations.  6.  Allergy to ERYTHROMYCIN.   PLAN:  1.  The patient will be made n.p.o. except for a sparing sips of water.  2.  Will give her intravenous normal saline at 125 mL per hour.  3.  Pain and nausea medication will be administered.  4.  The patient be continued on ciprofloxacin and Flagyl in appropriate      doses.  5.  Will evaluate her TSH in light of her noncompliance with her thyroid      medication.  6.  The patient will be observed carefully in the hospital setting. If fever      and abdominal pain should persist, a followup CT scan will be obtained      to rule out abscess, and, under those circumstances, surgical      consultation would be obtained.           ______________________________  Sherin Quarry, MD     SY/MEDQ  D:  12/11/2004  T:  12/11/2004  Job:  [id number]   cc:   Meredith Staggers, M.D.  [address]  [place]  [place] [address]  Fax: [phone]

## 2010-09-02 NOTE — Op Note (Signed)
NAME:  Mallory Carter, Mallory Carter                          ACCOUNT NO.:  [id number]   MEDICAL RECORD NO.:  [id number]                   PATIENT TYPE:  AMB   LOCATION:  DSC                                  FACILITY:  [facility]   PHYSICIAN:  Brantley Persons, M.D.             DATE OF BIRTH:  June 15, 1955   DATE OF PROCEDURE:  02/06/2003  DATE OF DISCHARGE:                                 OPERATIVE REPORT   POSTOPERATIVE DIAGNOSES:  1. Squamous cell cancer of left hand, dorsum.  2. 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   POSTOPERATIVE DIAGNOSES:  1. Squamous cell cancer of left hand, dorsum.  2. 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   PROCEDURES:  1. Wide local excision of 6.2 cm melanoma in situ, right mid to lower     paraspinal back.  2. Complex closure of 6.2 cm right paraspinal incision.  3. Excision of 1.2 left hand dorsum squamous cell carcinoma in situ.   ATTENDING SURGEON:  Brantley Persons, M.D.   ANESTHESIA:  1% lidocaine with epinephrine.   COMPLICATIONS:  None.   INDICATIONS FOR PROCEDURE:  The patient is a 55 year old Caucasian female  who has been referred by Dr. Theodora Blow. Danella Deis for excision of a squamous cell  cancer of the left hand dorsum as well as a melanoma in situ of the right  paraspinal back area.  Dr. Danella Deis has biopsied both of these lesions on  January 07, 2003.  The pathology report indicates that the left hand  dorsum skin lesion is a squamous cell carcinoma in situ arising in an  actinic keratosis and the right paraspinal back skin lesion is a melanoma in  situ arising in a dysplastic nevus.  The patient now presents for wide local  excision of the melanoma in situ which will be 5 mm and complete re-excision  of the left hand dorsum squamous cell cancer in situ.   PROCEDURE:  The patient was brought to the preoperative holding area and  placed on the table in the supine position.  The left hand was prepped with  Betadine and  draped in sterile fashion.  The skin and subcutaneous tissues  in the area of the healing biopsy site and skin cancer were then injected  with 1% lidocaine with epinephrine.  After adequate hemostasis and  anesthesia had taken effect the procedure was begun.  Using loupe  magnification the edges of the healing biopsy site and squamous cell cancer  were identified.  At least 1 mm margins were marked circumferentially around  this lesion.  The lesion was thus excised with these surgical margins in a  circumferential fashion full thickness through the skin and in to the  subcutaneous tissues.  The specimen was then marked at the 12:00 position  with a suture and passed off the table to undergo intraoperative frozen  section diagnosis.  I was on  standby while Dr. Charlott Rakes, the pathologist,  reviewed the specimen. He felt that all the carcinoma had been excised and  the surgical margins were free of any cancer.  The incision was then closed.  The skin edges were undermined for easier closure.  The dermal layer was  closed with a 4-0 Monocryl suture followed by a 4-0 Monocryl intracuticular  stitch on the skin.  The incision was dressed with Benzoin and Steri-Strips.  The patient was then placed in the prone position.  The back was prepped  with Betadine and draped in sterile fashion in the area of the melanoma.  Dr. Nino Parsley office had performed a complete excision of the melanoma;  however, since it is a melanoma in situ we needed to perform a wide local  excision.  After discussing this with the patient and the pathologist, we  decided on 5 mm margins circumferentially.  The skin and subcutaneous  tissues in the area of the healing biopsy site were then injected with 1%  lidocaine with epinephrine.  After adequate hemostasis and anesthesia had  taken effect the procedure was begun.  Using loupe magnification 5 mm  margins were marked circumferentially around the healing incision.  The   biopsy site was then excised with the appropriate margin, full thickness  through the skin, in to the subcutaneous tissues and down to the fascial  layer.  The specimen was marked at the 12:00 position and then passed off  the table to undergo permanent pathologic section evaluation.  The wound was  then closed in complex fashion.  The deep fascial layer was closed with 2-0  Monocryl suture. The subcutaneous tissue and the deep dermal layer were also  closed with 2-0 Monocryl suture.  Some of the superficial dermal layer was  closed with a 3-0 Monocryl suture.  The skin was then closed with a 3-0  Monocryl running intracuticular stitch.  The incision was dressed with  Benzoin and Steri-Strips followed by a 4 x 4 gauze dressing.  There were no  complications.  The patient tolerated the procedures well.  She was then  given proper postoperative wound care instructions and discharged home in  stable condition. A follow-up appointment will be tomorrow in the office.                                               Brantley Persons, M.D.    MC/MEDQ  D:  02/06/2003  T:  02/07/2003  Job:  [id number]   cc:   Hope M. Danella Deis, M.D.  [address]  [place]  [place] [address]  Fax: [phone]

## 2010-09-02 NOTE — Op Note (Signed)
NAMEGERARDINE, PELTZ                ACCOUNT NO.:  [id number]   MEDICAL RECORD NO.:  [id number]          PATIENT TYPE:  INP   LOCATION:  2550                         FACILITY:  [facility]   PHYSICIAN:  Sandria Bales. Ezzard Standing, M.D.  DATE OF BIRTH:  02/08/1956   DATE OF PROCEDURE:  05/25/2005  DATE OF DISCHARGE:                                 OPERATIVE REPORT   PREOPERATIVE DIAGNOSIS:  Recurrent sigmoid colon diverticulitis.   POSTOPERATIVE DIAGNOSIS:  Recurrent sigmoid colon diverticulitis with  adhesions to left tubo-ovarian complex.   PROCEDURE:  Laparoscopic assisted sigmoid colectomy with mobilization of  splenic flexure.   SURGEON:  Sandria Bales. Ezzard Standing, M.D.   FIRST ASSISTANT:  Timothy E. Earlene Plater, M.D.   ANESTHESIA:  General.   ESTIMATED BLOOD LOSS:  150 cc.   Drains left in were none.   INDICATIONS FOR PROCEDURE:  Ms. Mallory Carter is a 55 year old white female who was  hospitalized at [facility] in August for contained perforation of her  sigmoid colon from diverticular disease. She has had recurrent symptoms  since that time and now comes for a sigmoid colectomy. I discussed with her  about doing this laparoscopically assisted.   The indications and the initial complications were explained to the patient.  These complications include but are not limited to bleeding, infection, leak  from her bile and possibility of colostomy.   OPERATIVE NOTE:  The patient was placed in a supine position with her right  arm tucked. She was given 1 g of cefoxitin at the initiation of procedure.  She had a Foley catheter in place. Her abdomen was prepped with Betadine  solution and sterilely draped.   I made an infraumbilical incision and placed a 10 mm laparoscope through a  12 mm Hasson trocar. The Hasson trocar was secured with #0 Vicryl suture. I  then did a laparoscopic exploration. The right and left lobe of the liver  were unremarkable. The anterior wall of the stomach was unremarkable. The  patient did have adhesions of omentum to the lower midline area where she  had had prior surgery. I placed three 5 mm trocars; one in the left lower  quadrant, right lower quadrant and right upper quadrant trocar. I first took  down the adhesions in the anterior abdominal wall. I then looked down the  pelvis, took down some of the adhesions where it looked like the sigmoid  colon was stuck to the left tubo-ovarian complex. I then mobilized the left  colon at the pelvic brim, went up the left colon, got to the splenic flexure  and mobilized this down. This was all in case I needed more colon in doing  my anastomosis. I did round the corner of the splenic flexure to the left  transverse colon about 5-6 cm and I think I added about 8-10 cm of mobility  to the left colon in doing the anastomosis.   I then pulled out the trocars, made a lower midline incision with sharp  dissection carried down into the pelvis.   There were some more adhesions of the sigmoid colon to the  left tubo-ovarian  complex. These were taken down both with blunt dissection and Bovie  electrocautery. I did continue mobilization of the sigmoid colon, identified  the left ureter which was well posterior to our dissection.   I then identified a segment of sigmoid colon that I thought was beyond the  last diverticula and this was my distal dissection and then I went  proximally to the proximal sigmoid colon and divided it there. I divided the  mesentery with Kelly clamps and tied these off with 2-0 silk sutures.   I then carried out an end-to-end handsewn anastomosis using interrupted 2-0  silk sutures in an inverting fashion. The anterior wall of the colon I then  did Gambee sutures to close the anastomosis. The anastomosis easily admitted  a thumb. It lay over the pelvic brim and actually the anastomosis was almost  directly behind the uterus.   I then irrigated the pelvis out with about 3-4 L of warm saline. I then   placed the omentum between the uterus and the colon kind of to cover the  anastomosis. I then checked for the NG tube which was in good position and  then started abdominal closure.   I closed the abdomen with 2 running #1-0 PDS sutures which were tied with a  knot on both ends and 1 knot which tied the 2 in the middle.   I then irrigated the subcutaneous tissues, closed the skin with a skin gun,  placed Telfa wicks into the wound and then sterilely dressed the wound also.  I put a single staple in each of the three 5 mm trocar sites. The patient  tolerated the procedure well and was transported to the recovery room in  good condition.   Estimated blood loss was about 150 cc. Sponge and needle count were correct  at the end of the case. The patient tolerated the procedure well.      Sandria Bales. Ezzard Standing, M.D.  Electronically Signed     DHN/MEDQ  D:  05/25/2005  T:  05/25/2005  Job:  [id number]   cc:   Barbette Hair. Arlyce Dice, M.D. [facility]  [address]  [place]  [place] [address]   Molly Maduro A. Nicholos Johns, M.D.  Fax: [phone]

## 2012-04-13 ENCOUNTER — Emergency Department (HOSPITAL_COMMUNITY)
Admission: EM | Admit: 2012-04-13 | Discharge: 2012-04-13 | Disposition: A | Discharge status: Home / Self Care | Payer: Managed Care, Other (non HMO) | Attending: Emergency Medicine | Admitting: Emergency Medicine

## 2012-04-13 DIAGNOSIS — Z4789 Encounter for other orthopedic aftercare: Secondary | ICD-10-CM

## 2012-04-13 NOTE — ED Provider Notes (Signed)
History   This chart was scribed for [employer]. Rubin Payor, MD by Charolett Bumpers, ED Scribe. The patient was seen in room TR05C/TR05C. Patient's care was started at 1312.   CSN: [id number]  Arrival date & time 04/13/12  1228   First MD Initiated Contact with Patient 04/13/12 1312     CC: Recast  The history is provided by the patient. No language interpreter was used.  Mallory Carter is a 56 y.o. female who presents to the Emergency Department to have her   srugery to remove a bunion on 01/30/12. She states that she got the cast wet and needs it replaced.   No past medical history on file.  No past surgical history on file.  No family history on file.  History  Substance Use Topics  . Smoking status: Not on file  . Smokeless tobacco: Not on file  . Alcohol Use: Not on file    OB History    No data available      Review of Systems  All other systems reviewed and are negative.    Allergies  Review of patient's allergies indicates not on file.  Home Medications  No current outpatient prescriptions on file.  There were no vitals taken for this visit.  Physical Exam  Nursing note and vitals reviewed. Musculoskeletal:       Sensation and movement intact in toes. Normal capillary refill. Short leg cast in place on left lower leg   Neurological: She is alert.  Skin: Skin is warm and dry.  Psychiatric: Her behavior is normal.    ED Course  Procedures (including critical care time)  COORDINATION OF CARE:  13:20-Discussed planned course of treatment with the patient including recasting her left leg, who is agreeable at this time.    Labs Reviewed - No data to display No results found.   No diagnosis found.    MDM  Patient here for cast replacement. Seen by [facility] and cath was replaced   I personally performed the services described in this documentation, which was scribed in my presence. The recorded information has been reviewed and is  accurate.        Juliet Rude. Rubin Payor, MD 04/13/12 1326

## 2012-04-13 NOTE — ED Notes (Signed)
Patient send to Ed to have cast replaced only.  Her lower leg cast is wet.  Dr supple on call and ordered replacement cast.  No other services needed.  Ortho has been paged

## 2012-04-13 NOTE — ED Notes (Signed)
Patient has new cast in place.  She is to follow up with ortho as previously scheduled.  She is ambulatory with crutches at time of discharge.

## 2012-04-17 HISTORY — PX: ROTATOR CUFF REPAIR: SHX139

## 2012-06-12 ENCOUNTER — Other Ambulatory Visit (HOSPITAL_COMMUNITY): Payer: Self-pay | Admitting: Family Medicine

## 2012-06-12 DIAGNOSIS — Z1231 Encounter for screening mammogram for malignant neoplasm of breast: Secondary | ICD-10-CM

## 2012-06-19 ENCOUNTER — Ambulatory Visit (HOSPITAL_COMMUNITY)
Admission: RE | Admit: 2012-06-19 | Discharge: 2012-06-19 | Disposition: A | Discharge status: Home / Self Care | Payer: Managed Care, Other (non HMO) | Source: Ambulatory Visit | Attending: Family Medicine | Admitting: Family Medicine

## 2012-06-19 DIAGNOSIS — Z1231 Encounter for screening mammogram for malignant neoplasm of breast: Secondary | ICD-10-CM

## 2012-07-17 ENCOUNTER — Ambulatory Visit (INDEPENDENT_AMBULATORY_CARE_PROVIDER_SITE_OTHER): Payer: Managed Care, Other (non HMO) | Admitting: Certified Nurse Midwife

## 2012-07-17 ENCOUNTER — Encounter: Payer: Self-pay | Admitting: Certified Nurse Midwife

## 2012-07-17 VITALS — BP 112/62 | Ht 63.25 in | Wt 192.0 lb

## 2012-07-17 DIAGNOSIS — Z87898 Personal history of other specified conditions: Secondary | ICD-10-CM

## 2012-07-17 DIAGNOSIS — Z01419 Encounter for gynecological examination (general) (routine) without abnormal findings: Secondary | ICD-10-CM

## 2012-07-17 DIAGNOSIS — Z8742 Personal history of other diseases of the female genital tract: Secondary | ICD-10-CM

## 2012-07-17 DIAGNOSIS — N951 Menopausal and female climacteric states: Secondary | ICD-10-CM

## 2012-07-17 NOTE — Progress Notes (Signed)
57 y.o. MarriedCaucasian female   [phone] here for annual exam. Menopausal no vaginal bleeding or spotting. No HRT use.   Some hot flashes.  No issues with them. 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 Had cryo with all normal follow-up. Currently on medication for hypertension and elevated cholesterol.  Sees PCP yearly for labs and medication management.  Currently on Zoloft for anxiety, working well. No vaginal dryness or pain with sexual activity    Patient's last menstrual period was 07/17/2011.          Sexually active: yes  The current method of family planning is tubal ligation.    Exercising: yes  walking & gardening Last mammogram: 3/14  Last pap: 2012 per patient Last BMD: none Alcohol: 2 a week Tobacco: none Colonoscopy: 2011 per patient negative repeat in 10 years   Health Maintenance  Topic Date Due  . Influenza Vaccine  08-Oct-1955  . Pap Smear  12/22/1975  . Tetanus/tdap  12/21/1976  . Colonoscopy  12/22/2007  . Mammogram  06/20/2014    Family History  Problem Relation Age of Onset  . Diabetes Mother   . Hypertension Mother   . Cancer Father     skin  . Hypertension Father   . Diabetes Sister   . Diabetes Brother   . Hypertension Brother   . Diabetes Maternal Grandmother   . Diabetes Maternal Grandfather   . Stroke Maternal Grandfather   . Diabetes Brother     Patient Active Problem List  Diagnosis  . HYPOTHYROIDISM  . HYPERLIPIDEMIA  . OBSTRUCTIVE SLEEP APNEA  . HYPERTENSION    Past Medical History  Diagnosis Date  . Cancer     skin  . Anemia   . Anxiety   . Depression   . Dysmenorrhea   . Genital warts   . Hypertension   . Thyroid disease     no issues lately  . Urinary incontinence     occ    Past Surgical History  Procedure Laterality Date  . Appendectomy  1990  . Colon resection      took 18 inches out  . Cesarean section  1984, 1988  . Foot surgery    . Shoulder surgery  1990, 2000    calcium deposits  .  Carpal tunnel release    . Tonsillectomy  1963  . Tubal ligation      Allergies: Review of patient's allergies indicates no known allergies.  Current Outpatient Prescriptions  Medication Sig Dispense Refill  . aspirin 81 MG tablet Take 81 mg by mouth daily.      . Cholecalciferol (VITAMIN D) 2000 UNITS tablet Take 2,000 Units by mouth daily.      Marland Kitchen lisinopril (PRINIVIL,ZESTRIL) 20 MG tablet Take 20 mg by mouth daily.      . Multiple Vitamins-Minerals (MULTIVITAMIN PO) Take by mouth.      Marland Kitchen omeprazole (PRILOSEC) 20 MG capsule Take 20 mg by mouth daily.      . pravastatin (PRAVACHOL) 40 MG tablet Take 40 mg by mouth daily.      . sertraline (ZOLOFT) 100 MG tablet Take 100 mg by mouth daily.       No current facility-administered medications for this visit.    ROS: Pertinent items are noted in HPI.  Exam:    BP 112/62  Ht 5' 3.25" (1.607 m)  Wt 192 lb (87.091 kg)  BMI 33.72 kg/m2  LMP 07/17/2011 Weight change: @WEIGHTCHANGE @ Last 3 height  recordings:  Ht Readings from Last 3 Encounters:  07/17/12 5' 3.25" (1.607 m)  11/04/09 5\' 3"  (1.6 m)   General appearance: alert and cooperative Head: Normocephalic, without obvious abnormality, atraumatic Neck: no adenopathy, supple, symmetrical, trachea midline and thyroid not enlarged, symmetric, no tenderness/mass/nodules Lungs: clear to auscultation bilaterally Breasts: normal appearance, no masses or tenderness, Inspection negative, No nipple retraction or dimpling Heart: regular rate and rhythm Abdomen: soft, non-tender; bowel sounds normal; no masses,  no organomegaly Extremities: extremities normal, atraumatic, no cyanosis or edema Skin: Skin color, texture, turgor normal. No rashes or lesions Lymph nodes: Cervical, supraclavicular, and axillary nodes normal. no inguinal nodes palpated Neurologic: Alert and oriented X 3, normal strength and tone. Normal symmetric reflexes. Normal coordination and gait   Pelvic: External  genitalia:  no lesions              Urethra: normal appearing urethra with no masses, tenderness or lesions              Bartholins and Skenes: normal, Bartholin's, Urethra, Skene's normal                 Vagina: normal appearing vagina with normal color and discharge, no lesions              Cervix: normal appearance              Pap taken: yes        Bimanual Exam:  Uterus:  uterus is normal size, shape, consistency and nontender, anteverted                                      Adnexa:   normal adnexa in size, nontender and no masses                                      Rectovaginal: Confirms                                      Anus:  normal sphincter tone, no lesions  A:Well woman Reviewed health and wellness pertinent to exam Menopausal symptoms no menses in one year Hypertension and Cholesterol elevation on stable medication Anxiety and Depression on stable medication with PCP management  History of abnormal Pap smear with Cryo therapy HPV related per patient     P:Reviewed health and wellness pertinent to exam   mammogram Yearly pap smear yearly or as indicated Discussed checking FSH to indicate menopausal range and concerns if not with risk of hyperplasia or excessive bleeding Agreeable.  Also discussed to advise if vaginal bleeding occurs, may need further evaluation. Lab [facility] Continue follow up with PCP for other health problems and medication management Stressed annual exam with history of abnormal pap smear Questions addressed  return annually or prn      An After Visit Summary was printed and given to the patient.  Reviewed, TL

## 2012-07-17 NOTE — Patient Instructions (Signed)

## 2012-07-18 LAB — FOLLICLE STIMULATING HORMONE: FSH: 17 m[IU]/mL

## 2012-07-19 NOTE — Addendum Note (Signed)
Addended by: Verner Chol on: 07/19/2012 02:14 PM   Modules accepted: Orders

## 2012-07-22 LAB — IPS PAP SMEAR ONLY

## 2012-07-25 ENCOUNTER — Telehealth: Payer: Self-pay | Admitting: Certified Nurse Midwife

## 2012-08-06 ENCOUNTER — Telehealth: Payer: Self-pay | Admitting: *Deleted

## 2012-08-06 NOTE — Telephone Encounter (Signed)
LMTCB regarding PUS/endo bx appt.

## 2012-08-14 NOTE — Telephone Encounter (Signed)
Opened in error

## 2012-09-04 ENCOUNTER — Other Ambulatory Visit: Payer: Self-pay | Admitting: *Deleted

## 2012-09-04 ENCOUNTER — Ambulatory Visit (INDEPENDENT_AMBULATORY_CARE_PROVIDER_SITE_OTHER): Payer: Managed Care, Other (non HMO) | Admitting: Gynecology

## 2012-09-04 ENCOUNTER — Ambulatory Visit (INDEPENDENT_AMBULATORY_CARE_PROVIDER_SITE_OTHER): Payer: Managed Care, Other (non HMO)

## 2012-09-04 DIAGNOSIS — N912 Amenorrhea, unspecified: Secondary | ICD-10-CM

## 2012-09-04 DIAGNOSIS — D259 Leiomyoma of uterus, unspecified: Secondary | ICD-10-CM

## 2012-09-04 NOTE — Progress Notes (Signed)
Pt here for u/s to evaluate endometrium and adnexa.  Pt is postmenopausal by history with no menses for 1y however her FSH was only 17.   U/S images were reviewed with pt, normal pelvis with EMS of 3.6, possible follicle on right, em not trilayered Discussed with pt-question if she is producing follicles with estrogen that are insufficient  Progestin although her lining is thin. Pt offered EMB to determine if any intervention is indicated and she is agreeable. Risk and benefits were reviewed and accepted- consent obtained. Speculum was placed, cervix was cleansed with betadine.  Anterior lip and endocervix treated with 2%lidocaine jelly, cervix stabilized with single tooth, pipelle advanced to fundus, sounded to 7, 2 passes for minimal tissue, tolerated well. Tissue to path

## 2012-09-06 LAB — IPS CERVICAL/ECC/EMB/VULVAR/VAGINAL BIOPSY

## 2012-10-25 ENCOUNTER — Encounter: Payer: Self-pay | Admitting: Pulmonary Disease

## 2012-10-25 ENCOUNTER — Ambulatory Visit (INDEPENDENT_AMBULATORY_CARE_PROVIDER_SITE_OTHER): Payer: Managed Care, Other (non HMO) | Admitting: Pulmonary Disease

## 2012-10-25 VITALS — BP 128/90 | HR 83 | Temp 97.0°F | Ht 64.5 in | Wt 198.6 lb

## 2012-10-25 DIAGNOSIS — G4733 Obstructive sleep apnea (adult) (pediatric): Secondary | ICD-10-CM

## 2012-10-25 NOTE — Assessment & Plan Note (Signed)
The patient has a history of mild obstructive sleep apnea, but has been on CPAP compliantly since 2005.  She is now having issues with her device not working properly, and feels the pressure from the device is no longer adequate.  Her machine is over due for replacement, and we'll therefore send an order to her medical equipment company for a replacement.  I have also encouraged her to work aggressively on weight loss.

## 2012-10-25 NOTE — Patient Instructions (Addendum)
Will send an order to [facility] to get you a new cpap machine. Either change your mask seal to see if it will work better, or consider trying a different mask. Work on weight loss followup with me in one year.

## 2012-10-25 NOTE — Progress Notes (Signed)
  Subjective:    Patient ID: Mallory Carter, female    DOB: May 12, 1955, 57 y.o.   MRN: [id number]  HPI The patient comes in today for followup of her chart of sleep apnea.  She has not been seen since 2011, but tells me that she's been wearing her CPAP compliant.  I do have a download for the last 6 months which shows excellent compliance.  The patient tells me that her machine has been turning off and on by itself, and is not working properly.  She does not feel the pressure is as strong as in the past.  She was doing very well prior to the machine malfunctioning, and felt like she slept well with adequate daytime alertness.  Now she is having increased sleeping issues.  Her mask is only 60 months old, but does not fit as well as she would like.  Of note, the patient's weight is up 11 pounds since the last visit.   Review of Systems  Constitutional: Negative for fever and unexpected weight change.  HENT: Negative for ear pain, nosebleeds, congestion, sore throat, rhinorrhea, sneezing, trouble swallowing, dental problem, postnasal drip and sinus pressure.   Eyes: Negative for redness and itching.  Respiratory: Negative for cough, chest tightness, shortness of breath and wheezing.   Cardiovascular: Negative for palpitations and leg swelling.  Gastrointestinal: Negative for nausea and vomiting.  Genitourinary: Negative for dysuria.  Musculoskeletal: Negative for joint swelling.  Skin: Negative for rash.  Neurological: Negative for headaches.  Hematological: Does not bruise/bleed easily.  Psychiatric/Behavioral: Negative for dysphoric mood. The patient is not nervous/anxious.        Objective:   Physical Exam Overweight female in no acute distress Nose without purulence or discharge noted No skin breakdown or pressure necrosis from CPAP mask Neck without lymphadenopathy or thyromegaly Chest clear to auscultation Cardiac exam with regular rate and rhythm Lower extremities without edema, no  cyanosis Alert, does not appear to be sleepy, moves all 4 extremities.       Assessment & Plan:

## 2013-02-04 ENCOUNTER — Other Ambulatory Visit: Payer: Self-pay | Admitting: Family Medicine

## 2013-02-04 DIAGNOSIS — R7989 Other specified abnormal findings of blood chemistry: Secondary | ICD-10-CM

## 2013-02-06 ENCOUNTER — Ambulatory Visit
Admission: RE | Admit: 2013-02-06 | Discharge: 2013-02-06 | Disposition: A | Discharge status: Home / Self Care | Payer: Managed Care, Other (non HMO) | Source: Ambulatory Visit | Attending: Family Medicine | Admitting: Family Medicine

## 2013-02-06 DIAGNOSIS — R7989 Other specified abnormal findings of blood chemistry: Secondary | ICD-10-CM

## 2013-03-27 ENCOUNTER — Encounter: Payer: Managed Care, Other (non HMO) | Attending: Family Medicine | Admitting: *Deleted

## 2013-03-27 ENCOUNTER — Encounter: Payer: Self-pay | Admitting: *Deleted

## 2013-03-27 DIAGNOSIS — R7309 Other abnormal glucose: Secondary | ICD-10-CM | POA: Insufficient documentation

## 2013-03-27 DIAGNOSIS — E785 Hyperlipidemia, unspecified: Secondary | ICD-10-CM

## 2013-03-27 DIAGNOSIS — I10 Essential (primary) hypertension: Secondary | ICD-10-CM

## 2013-03-27 DIAGNOSIS — Z713 Dietary counseling and surveillance: Secondary | ICD-10-CM | POA: Insufficient documentation

## 2013-03-27 DIAGNOSIS — R7303 Prediabetes: Secondary | ICD-10-CM

## 2013-03-27 DIAGNOSIS — E669 Obesity, unspecified: Secondary | ICD-10-CM

## 2013-03-27 NOTE — Progress Notes (Signed)
  Medical Nutrition Therapy:  Appt start time: 0800 end time:  0900.  Assessment:  Primary concerns today: Referred here for prediabetes, also has hypertension and hyperlipidemia. Quintasha states she knows about her family history and is concerned about all of these things.  Naomia works full time at [employer] 5:50am - 1:45pm.  Elda lives with her husband.  Both do the food shopping and food preparation.  They eat a lot of chicken, less red meat, pork.  Zian states she avoids a lot of breads, eats fast food about once a week and would get a kids meal from [employer].  Drinks diet sodas maybe once a week.  Avoids snacking after dinner. She states her husband is not a very healthy eater, he likes mac&cheese, gravies, and breads. Taniyah states she is trying to watch her portions and do better about sodium.    Wenonah is concerned about her lab results, but unfortunately we do not have her lab results today due to a failure of the fax machine.    Preferred Learning Style:  Auditory, Visual  Learning Readiness:   Ready  MEDICATIONS: see list   DIETARY INTAKE:  24-hr recall:  B ( AM): cheerios or couple slices of raisin bread, or egg sandwich with rosemary or yogurt Snk ( AM): pear, special K caramel pretzel bites  L ( PM): super kale salad from [employer] with lite ginger dressing with tomatoes Snk ( PM): 1 cup homemade chex mix with bagel bites D ( PM): tuna fish sandwhich with chicken noodle soup Snk ( PM): trying not to snack after dinner Beverages: water, 2.5 cups coffee with sugar-free italian sweet cream, lady grey hot tea w tsp sugar 2x/week when cold  Usual physical activity: walks some, pool exercises in the summer   Estimated energy needs: 1500 calories 170 g carbohydrates 112 g protein 42 g fat  Progress Towards Goal(s):  In progress.   Nutritional Diagnosis:  NB-1.1 Food and nutrition-related knowledge deficit As related to proper balance of fats, carbohydrates, and proteins.  As  evidenced by obesity, and self-reported HTN, hyperlipidemia, and prediabetes.    Intervention:  Nutrition counseling provided.  Discussed physiology of diabetes and how carbohydrate metabolism affects blood glucose.  dicussed carb counting and how to minimize glycemia load by choosing high-fiber foods paid with high protein foods.  Recommended non-starchy vegetables and limiting 30-45 g carbohydrates/meal.  Recommended using heart-healthy fats like olive oil, nuts, and avocados.  Recommended increasing physical activity either by walking or through water exercises.    Teaching Method Utilized:  Visual Auditory  Handouts given during visit include:  My meal plan card  Barriers to learning/adherence to lifestyle change: none  Demonstrated degree of understanding via:  Teach Back   Monitoring/Evaluation:  Dietary intake, exercise, lab results, and body weight in 3 month(s).

## 2013-06-25 ENCOUNTER — Ambulatory Visit: Payer: Managed Care, Other (non HMO) | Admitting: *Deleted

## 2013-07-22 ENCOUNTER — Ambulatory Visit (INDEPENDENT_AMBULATORY_CARE_PROVIDER_SITE_OTHER): Payer: Managed Care, Other (non HMO) | Admitting: Certified Nurse Midwife

## 2013-07-22 ENCOUNTER — Encounter: Payer: Self-pay | Admitting: Certified Nurse Midwife

## 2013-07-22 VITALS — BP 114/76 | HR 80 | Resp 16 | Ht 63.5 in | Wt 187.0 lb

## 2013-07-22 DIAGNOSIS — Z01419 Encounter for gynecological examination (general) (routine) without abnormal findings: Secondary | ICD-10-CM

## 2013-07-22 NOTE — Progress Notes (Signed)
58 y.o. G37P2002 Married Caucasian Fe here for annual exam. Menopausal no HRT. Denies vaginal bleeding. Some vaginal dryness on occasion. Sees PCP yearly for cholesterol and hypertension management and osteopenia management. No medication changes in past year. Mammogram due, plans to schedule soon. No health issues today. Planning a cruise for vacation!  Patient's last menstrual period was 07/17/2011.          Sexually active: yes  The current method of family planning is tubal ligation.    Exercising: yes  walking & tone up Smoker:  no  Health Maintenance: Pap:  07-17-12 neg HPV HR neg MMG:  06-19-12 nomal Colonoscopy:  2011 neg, repeat 10 yrs BMD:   2014 TDaP:  Unsure per patient up to date Labs: none Self breast exam: done occ   reports that she has never smoked. She does not have any smokeless tobacco history on file. She reports that she drinks about 0.5 ounces of alcohol per week. She reports that she does not use illicit drugs.  Past Medical History  Diagnosis Date  . Cancer     skin  . Anemia   . Anxiety   . Depression   . Dysmenorrhea   . Genital warts   . Hypertension   . Thyroid disease     no issues lately  . Urinary incontinence     occ    Past Surgical History  Procedure Laterality Date  . Appendectomy  1990  . Colon resection      took 18 inches out  . Cesarean section  1984, 1988  . Foot surgery    . Shoulder surgery  1990, 2000    calcium deposits  . Carpal tunnel release    . Tonsillectomy  1963  . Tubal ligation    . Rotator cuff repair  2014    Current Outpatient Prescriptions  Medication Sig Dispense Refill  . Alendronate Sodium (FOSAMAX PO) Take by mouth once a week.      . Cholecalciferol (VITAMIN D) 2000 UNITS tablet Take 2,000 Units by mouth daily.      Marland Kitchen lisinopril (PRINIVIL,ZESTRIL) 20 MG tablet Take 20 mg by mouth daily.      . Multiple Vitamins-Minerals (MULTIVITAMIN PO) Take by mouth.      Marland Kitchen omeprazole (PRILOSEC) 20 MG capsule Take 20  mg by mouth daily.      . pravastatin (PRAVACHOL) 40 MG tablet Take 40 mg by mouth daily.      . sertraline (ZOLOFT) 100 MG tablet Take 100 mg by mouth daily.       No current facility-administered medications for this visit.    Family History  Problem Relation Age of Onset  . Diabetes Mother   . Hypertension Mother   . Cancer Father     skin  . Hypertension Father   . Diabetes Sister   . Diabetes Brother   . Hypertension Brother   . Heart attack Brother   . Diabetes Maternal Grandmother   . Diabetes Maternal Grandfather   . Stroke Maternal Grandfather   . Diabetes Brother     ROS:  Pertinent items are noted in HPI.  Otherwise, a comprehensive ROS was negative.  Exam:   BP 114/76  Pulse 80  Resp 16  Ht 5' 3.5" (1.613 m)  Wt 187 lb (84.823 kg)  BMI 32.60 kg/m2  LMP 07/17/2011 Height: 5' 3.5" (161.3 cm)  Ht Readings from Last 3 Encounters:  07/22/13 5' 3.5" (1.613 m)  10/25/12 5' 4.5" (1.638 m)  07/17/12 5' 3.25" (1.607 m)    General appearance: alert, cooperative and appears stated age Head: Normocephalic, without obvious abnormality, atraumatic Neck: no adenopathy, supple, symmetrical, trachea midline and thyroid normal to inspection and palpation and non-palpable Lungs: clear to auscultation bilaterally Breasts: normal appearance, no masses or tenderness, No nipple retraction or dimpling, No nipple discharge or bleeding, No axillary or supraclavicular adenopathy Heart: regular rate and rhythm Abdomen: soft, non-tender; no masses,  no organomegaly Extremities: extremities normal, atraumatic, no cyanosis or edema Skin: Skin color, texture, turgor normal. No rashes or lesions Lymph nodes: Cervical, supraclavicular, and axillary nodes normal. No abnormal inguinal nodes palpated Neurologic: Grossly normal   Pelvic: External genitalia:  no lesions              Urethra:  normal appearing urethra with no masses, tenderness or lesions              Bartholin's and  Skene's: normal                 Vagina: normal appearing vagina with normal color and discharge, no lesions, at introitus only small area of dryness noted, no atrophy              Cervix: normal, non tender              Pap taken: no Bimanual Exam:  Uterus:  normal size, contour, position, consistency, mobility, non-tender and mid position              Adnexa: normal adnexa and no mass, fullness, tenderness               Rectovaginal: Confirms               Anus:  normal sphincter tone, no lesions  A:  Well Woman with normal exam  Menopausal no HRT  Vaginal dryness at introitus only  Hypertension/cholesterol/osteopenia management by PCP  P:   Reviewed health and wellness pertinent to exam  Aware of need to evaluate if vaginal bleeding  Suggested Olive oil or coconut oil to area daily/prn or OTC moisturizer. Advise if no change or increases  Continue follow up as indicated  Pap smear as per guidelines   Mammogram yearly pap smear not taken today  counseled on breast self exam, mammography screening, adequate intake of calcium and vitamin D, diet and exercise, work on weight loss for increase health benefit.  return annually or prn  An After Visit Summary was printed and given to the patient.

## 2013-07-22 NOTE — Patient Instructions (Addendum)

## 2013-07-23 NOTE — Progress Notes (Signed)
Reviewed personally.  M. Suzanne Anola Mcgough, MD.  

## 2014-02-16 ENCOUNTER — Encounter: Payer: Self-pay | Admitting: Certified Nurse Midwife

## 2014-07-24 ENCOUNTER — Ambulatory Visit: Payer: Managed Care, Other (non HMO) | Admitting: Certified Nurse Midwife

## 2014-09-07 ENCOUNTER — Encounter: Payer: Self-pay | Admitting: Certified Nurse Midwife

## 2014-09-07 ENCOUNTER — Ambulatory Visit (INDEPENDENT_AMBULATORY_CARE_PROVIDER_SITE_OTHER): Payer: Managed Care, Other (non HMO) | Admitting: Certified Nurse Midwife

## 2014-09-07 VITALS — BP 120/70 | HR 88 | Resp 20 | Ht 63.25 in | Wt 186.0 lb

## 2014-09-07 DIAGNOSIS — Z124 Encounter for screening for malignant neoplasm of cervix: Secondary | ICD-10-CM | POA: Diagnosis not present

## 2014-09-07 DIAGNOSIS — Z01419 Encounter for gynecological examination (general) (routine) without abnormal findings: Secondary | ICD-10-CM

## 2014-09-07 NOTE — Progress Notes (Signed)
59 y.o. G30P2002 Married  Caucasian Fe here for annual exam.Menopausal no HRT. Denies any vaginal bleeding or vaginal dryness. See PCP yearly for medication management of hypertension, cholesterol and anxiety, labs and aex. Daughter recently married! No other health issues today.   Patient's last menstrual period was 07/17/2011.          Sexually active: Yes.    The current method of family planning is tubal ligation and post menopausal status.    Exercising: Yes.    Walking daily Smoker:  no  Health Maintenance: Pap: 07/17/12 Neg. HR HPV:neg MMG:  06/20/12 BIRADS1:Neg Self Breast Exam: yes, occ Colonoscopy: 2011 Normal - per pt  10 years BMD: 2012? Normal  TDaP:  PCP UTD Labs: PCP   reports that she has never smoked. She has never used smokeless tobacco. She reports that she drinks about 0.5 - 1.0 oz of alcohol per week. She reports that she does not use illicit drugs.  Past Medical History  Diagnosis Date  . Cancer     skin  . Anemia   . Anxiety   . Depression   . Dysmenorrhea   . Genital warts   . Hypertension   . Thyroid disease     no issues lately  . Urinary incontinence     occ    Past Surgical History  Procedure Laterality Date  . Appendectomy  1990  . Colon resection      took 18 inches out  . Cesarean section  1984, 1988  . Foot surgery    . Shoulder surgery  1990, 2000    calcium deposits  . Carpal tunnel release    . Tonsillectomy  1963  . Tubal ligation    . Rotator cuff repair  2014    Current Outpatient Prescriptions  Medication Sig Dispense Refill  . Cholecalciferol (VITAMIN D) 2000 UNITS tablet Take 2,000 Units by mouth daily.    Marland Kitchen lisinopril (PRINIVIL,ZESTRIL) 20 MG tablet Take 20 mg by mouth daily.    . Multiple Vitamins-Minerals (MULTIVITAMIN PO) Take by mouth.    Marland Kitchen omeprazole (PRILOSEC) 20 MG capsule Take 20 mg by mouth daily.    . pravastatin (PRAVACHOL) 40 MG tablet Take 40 mg by mouth daily.    . sertraline (ZOLOFT) 100 MG tablet Take 100 mg  by mouth daily.     No current facility-administered medications for this visit.    Family History  Problem Relation Age of Onset  . Diabetes Mother   . Hypertension Mother   . Cancer Father     skin  . Hypertension Father   . Diabetes Sister   . Diabetes Brother   . Hypertension Brother   . Heart attack Brother   . Diabetes Maternal Grandmother   . Diabetes Maternal Grandfather   . Stroke Maternal Grandfather   . Diabetes Brother     ROS:  Pertinent items are noted in HPI.  Otherwise, a comprehensive ROS was negative.  Exam:   BP 120/70 mmHg  Pulse 88  Resp 20  Ht 5' 3.25" (1.607 m)  Wt 186 lb (84.369 kg)  BMI 32.67 kg/m2  LMP 07/17/2011 Height: 5' 3.25" (160.7 cm) Ht Readings from Last 3 Encounters:  09/07/14 5' 3.25" (1.607 m)  07/22/13 5' 3.5" (1.613 m)  10/25/12 5' 4.5" (1.638 m)    General appearance: alert, cooperative and appears stated age Head: Normocephalic, without obvious abnormality, atraumatic Neck: no adenopathy, supple, symmetrical, trachea midline and thyroid normal to inspection and palpation Lungs:  clear to auscultation bilaterally Breasts: normal appearance, no masses or tenderness, No nipple retraction or dimpling, No nipple discharge or bleeding, No axillary or supraclavicular adenopathy Heart: regular rate and rhythm Abdomen: soft, non-tender; no masses,  no organomegaly Extremities: extremities normal, atraumatic, no cyanosis or edema Skin: Skin color, texture, turgor normal. No rashes or lesions Lymph nodes: Cervical, supraclavicular, and axillary nodes normal. No abnormal inguinal nodes palpated Neurologic: Grossly normal   Pelvic: External genitalia:  no lesions              Urethra:  normal appearing urethra with no masses, tenderness or lesions              Bartholin's and Skene's: normal                 Vagina: normal appearing vagina with normal color and discharge, no lesions              Cervix: normal, non tender, no lesions               Pap taken: Yes.   Bimanual Exam:  Uterus:  normal size, contour, position, consistency, mobility, non-tender              Adnexa: normal adnexa and no mass, fullness, tenderness               Rectovaginal: Confirms               Anus:  normal sphincter tone, no lesions  Chaperone present: Yes  A:  Well Woman with normal exam  Menopausal no HRT  Hypertension/Cholesterol and Anxiety medication with  PCP  Mammogram overdue  P:   Reviewed health and wellness pertinent to exam  Pap smear taken today with HPV reflex  Continue follow up with PCP as indicated  Stressed scheduling of mammogram, patient plans to do today   counseled on breast self exam, HIV risk factors and prevention, adequate intake of calcium and vitamin D, diet and exercise  return annually or prn  An After Visit Summary was printed and given to the patient.

## 2014-09-07 NOTE — Patient Instructions (Signed)

## 2014-09-08 NOTE — Progress Notes (Signed)
Reviewed personally.  M. Suzanne Loa Idler, MD.  

## 2014-09-10 LAB — IPS PAP TEST WITH REFLEX TO HPV

## 2014-10-23 ENCOUNTER — Other Ambulatory Visit: Payer: Self-pay | Admitting: Certified Nurse Midwife

## 2014-10-23 ENCOUNTER — Other Ambulatory Visit (HOSPITAL_COMMUNITY): Payer: Self-pay | Admitting: Gynecology

## 2014-10-23 ENCOUNTER — Other Ambulatory Visit (HOSPITAL_COMMUNITY): Payer: Self-pay | Admitting: Family Medicine

## 2014-10-23 DIAGNOSIS — Z1231 Encounter for screening mammogram for malignant neoplasm of breast: Secondary | ICD-10-CM

## 2014-10-27 ENCOUNTER — Ambulatory Visit (HOSPITAL_COMMUNITY)
Admission: RE | Admit: 2014-10-27 | Discharge: 2014-10-27 | Disposition: A | Discharge status: Home / Self Care | Payer: Managed Care, Other (non HMO) | Source: Ambulatory Visit | Attending: Family Medicine | Admitting: Family Medicine

## 2014-10-27 DIAGNOSIS — Z1231 Encounter for screening mammogram for malignant neoplasm of breast: Secondary | ICD-10-CM | POA: Insufficient documentation

## 2015-09-09 ENCOUNTER — Ambulatory Visit (INDEPENDENT_AMBULATORY_CARE_PROVIDER_SITE_OTHER): Payer: Managed Care, Other (non HMO) | Admitting: Certified Nurse Midwife

## 2015-09-09 ENCOUNTER — Telehealth: Payer: Self-pay | Admitting: Certified Nurse Midwife

## 2015-09-09 ENCOUNTER — Encounter: Payer: Self-pay | Admitting: Certified Nurse Midwife

## 2015-09-09 VITALS — BP 108/70 | HR 70 | Resp 16 | Ht 63.25 in | Wt 190.0 lb

## 2015-09-09 DIAGNOSIS — Z01419 Encounter for gynecological examination (general) (routine) without abnormal findings: Secondary | ICD-10-CM

## 2015-09-09 NOTE — Telephone Encounter (Signed)
Please disregard made in error °

## 2015-09-09 NOTE — Progress Notes (Signed)
60 y.o. G36P2002 Married  Caucasian Fe here for annual exam. Menopausal, no HRT. Occasional stress incontinence, but only with full bladder. Denies any urinary frequency or urgency or dysuria. Limits soda and tea. Denies any vaginal dryness or vaginal bleeding.Patient sees PCP for hypertension/anxiety/hypothyroid medication management/ labs and aex.. All stable per patient. No other health issues today. Planning beach trip with entire family!  Patient's last menstrual period was 07/17/2011.          Sexually active: Yes.    The current method of family planning is tubal ligation.    Exercising: Yes.    gym,swimming & walking Smoker:  no  Health Maintenance: Pap: 09-07-14 neg MMG:  10-27-14 category b density,birads 1:neg Colonoscopy:  2011 normal per patient f/u 12yrs BMD:   2012? normal TDaP:  pcp utd Shingles: no Pneumonia: no Hep C and HIV: not done Labs: pcp Self breast exam: done occ   reports that she has never smoked. She has never used smokeless tobacco. She reports that she drinks about 1.2 oz of alcohol per week. She reports that she does not use illicit drugs.  Past Medical History  Diagnosis Date  . Cancer ([place])     skin  . Anemia   . Anxiety   . Depression   . Dysmenorrhea   . Genital warts   . Hypertension   . Thyroid disease     no issues lately  . Urinary incontinence     occ    Past Surgical History  Procedure Laterality Date  . Appendectomy  1990  . Colon resection      took 18 inches out  . Cesarean section  1984, 1988  . Foot surgery    . Shoulder surgery  1990, 2000    calcium deposits  . Carpal tunnel release    . Tonsillectomy  1963  . Tubal ligation    . Rotator cuff repair  2014    Current Outpatient Prescriptions  Medication Sig Dispense Refill  . Cholecalciferol (VITAMIN D) 2000 UNITS tablet Take 2,000 Units by mouth daily.    Marland Kitchen lisinopril (PRINIVIL,ZESTRIL) 20 MG tablet Take 20 mg by mouth daily.    . Multiple Vitamins-Minerals  (MULTIVITAMIN PO) Take by mouth.    Marland Kitchen omeprazole (PRILOSEC) 20 MG capsule Take 20 mg by mouth daily.    . pravastatin (PRAVACHOL) 40 MG tablet Take 40 mg by mouth daily.    . sertraline (ZOLOFT) 100 MG tablet Take 100 mg by mouth daily.     No current facility-administered medications for this visit.    Family History  Problem Relation Age of Onset  . Diabetes Mother   . Hypertension Mother   . Cancer Father     skin  . Hypertension Father   . Diabetes Sister   . Diabetes Brother   . Hypertension Brother   . Heart attack Brother   . Diabetes Maternal Grandmother   . Diabetes Maternal Grandfather   . Stroke Maternal Grandfather   . Diabetes Brother     ROS:  Pertinent items are noted in HPI.  Otherwise, a comprehensive ROS was negative.  Exam:   BP 108/70 mmHg  Pulse 70  Resp 16  Ht 5' 3.25" (1.607 m)  Wt 190 lb (86.183 kg)  BMI 33.37 kg/m2  LMP 07/17/2011 Height: 5' 3.25" (160.7 cm) Ht Readings from Last 3 Encounters:  09/09/15 5' 3.25" (1.607 m)  09/07/14 5' 3.25" (1.607 m)  07/22/13 5' 3.5" (1.613 m)  General appearance: alert, cooperative and appears stated age Head: Normocephalic, without obvious abnormality, atraumatic Neck: no adenopathy, supple, symmetrical, trachea midline and thyroid normal to inspection and palpation Lungs: clear to auscultation bilaterally Breasts: normal appearance, no masses or tenderness, No nipple retraction or dimpling, No nipple discharge or bleeding, No axillary or supraclavicular adenopathy Heart: regular rate and rhythm Abdomen: soft, non-tender; no masses,  no organomegaly Extremities: extremities normal, atraumatic, no cyanosis or edema Skin: Skin color, texture, turgor normal. No rashes or lesions Lymph nodes: Cervical, supraclavicular, and axillary nodes normal. No abnormal inguinal nodes palpated Neurologic: Grossly normal   Pelvic: External genitalia:  no lesions              Urethra:  normal appearing urethra with  no masses, tenderness or lesions              Bartholin's and Skene's: normal                 Vagina: normal appearing vagina with normal color and discharge, no lesions              Cervix: normal,non tender,no lesions              Pap taken: No. Bimanual Exam:  Uterus:  normal size, contour, position, consistency, mobility, non-tender and mid position              Adnexa: normal adnexa and no mass, fullness, tenderness               Rectovaginal: Confirms               Anus:  normal sphincter tone, no lesions  Chaperone present: yes  A:  Well Woman with normal exam  Menopausal no HRT  Hypertension, Hypothyroid,Cholesterol, Anxiety management with PCP  P:   Reviewed health and wellness pertinent to exam  Discussed need to evaluate if vaginal bleeding  Continue follow up with PCP as indicated  Pap smear as above not taken   counseled on breast self exam, mammography screening, adequate intake of calcium and vitamin D, diet and exercise, Kegel's exercises  return annually or prn  An After Visit Summary was printed and given to the patient.

## 2015-09-09 NOTE — Progress Notes (Signed)
Reviewed personally.  M. Suzanne Levette Paulick, MD.  

## 2015-09-09 NOTE — Patient Instructions (Signed)

## 2016-09-05 ENCOUNTER — Other Ambulatory Visit: Payer: Self-pay | Admitting: Family Medicine

## 2016-09-05 DIAGNOSIS — R1011 Right upper quadrant pain: Secondary | ICD-10-CM

## 2016-09-15 ENCOUNTER — Ambulatory Visit
Admission: RE | Admit: 2016-09-15 | Discharge: 2016-09-15 | Disposition: A | Discharge status: Home / Self Care | Payer: Managed Care, Other (non HMO) | Source: Ambulatory Visit | Attending: Family Medicine | Admitting: Family Medicine

## 2016-09-15 DIAGNOSIS — R1011 Right upper quadrant pain: Secondary | ICD-10-CM

## 2016-09-19 ENCOUNTER — Ambulatory Visit (INDEPENDENT_AMBULATORY_CARE_PROVIDER_SITE_OTHER): Payer: Managed Care, Other (non HMO) | Admitting: Certified Nurse Midwife

## 2016-09-19 ENCOUNTER — Encounter: Payer: Self-pay | Admitting: Certified Nurse Midwife

## 2016-09-19 ENCOUNTER — Other Ambulatory Visit (HOSPITAL_COMMUNITY)
Admission: RE | Admit: 2016-09-19 | Discharge: 2016-09-19 | Disposition: A | Discharge status: Home / Self Care | Payer: Managed Care, Other (non HMO) | Source: Ambulatory Visit | Attending: Certified Nurse Midwife | Admitting: Certified Nurse Midwife

## 2016-09-19 VITALS — BP 112/70 | HR 72 | Resp 16 | Ht 63.0 in | Wt 192.0 lb

## 2016-09-19 DIAGNOSIS — Z01419 Encounter for gynecological examination (general) (routine) without abnormal findings: Secondary | ICD-10-CM | POA: Diagnosis not present

## 2016-09-19 DIAGNOSIS — Z124 Encounter for screening for malignant neoplasm of cervix: Secondary | ICD-10-CM | POA: Diagnosis not present

## 2016-09-19 NOTE — Progress Notes (Signed)
61 y.o. G72P2002 Married  Caucasian Fe here for annual exam. Denies vaginal bleeding or vaginal dryness.  Sees PCP for aex, hypertension/Hypothyroidism/hyperlipidemia and new diagnosis of Type 2 diabetes diagnosis and labs. Has BMD scheduled 09/28/16, PCP manages. No health issues today. Planning to go boating at [facility] soon!   Patient's last menstrual period was 11/01/2011.          Sexually active: Yes.    The current method of family planning is tubal ligation.    Exercising: Yes.    walking Smoker:  no  Health Maintenance: Pap:  09-07-14 neg History of Abnormal Pap: late 1980s ? MMG:  10-27-14 category b density birads 1:neg Self Breast exams: yes Colonoscopy:  2011 normal per patient f/u 6yrs BMD:   2013? TDaP:  pcp utd Shingles: no Pneumonia: no Hep C and HIV: not done Labs: none   reports that she has never smoked. She has never used smokeless tobacco. She reports that she drinks about 1.2 oz of alcohol per week . She reports that she does not use drugs.  Past Medical History:  Diagnosis Date  . Anemia   . Anxiety   . Cancer ([place])    skin  . Depression   . Dysmenorrhea   . Genital warts   . Hypertension   . Thyroid disease    no issues lately  . Urinary incontinence    occ    Past Surgical History:  Procedure Laterality Date  . APPENDECTOMY  1990  . CARPAL TUNNEL RELEASE    . [place]  . COLON RESECTION     took 18 inches out  . FOOT SURGERY    . ROTATOR CUFF REPAIR  2014  . [place]   calcium deposits  . TONSILLECTOMY  1963  . TUBAL LIGATION      Current Outpatient Prescriptions  Medication Sig Dispense Refill  . Cholecalciferol (VITAMIN D) 2000 UNITS tablet Take 2,000 Units by mouth daily.    Marland Kitchen lisinopril (PRINIVIL,ZESTRIL) 20 MG tablet Take 20 mg by mouth daily.    Marland Kitchen omeprazole (PRILOSEC) 20 MG capsule Take 20 mg by mouth daily.    . pravastatin (PRAVACHOL) 40 MG tablet Take 40 mg by mouth daily.    .  sertraline (ZOLOFT) 100 MG tablet Take 100 mg by mouth daily.     No current facility-administered medications for this visit.     Family History  Problem Relation Age of Onset  . Diabetes Mother   . Hypertension Mother   . Cancer Father        skin  . Hypertension Father   . Diabetes Sister   . Diabetes Brother   . Hypertension Brother   . Heart attack Brother   . Diabetes Maternal Grandmother   . Diabetes Maternal Grandfather   . Stroke Maternal Grandfather   . Diabetes Brother     ROS:  Pertinent items are noted in HPI.  Otherwise, a comprehensive ROS was negative.  Exam:   BP 112/70   Pulse 72   Resp 16   Ht 5\' 3"  (1.6 m)   Wt 192 lb (87.1 kg)   LMP 11/01/2011   BMI 34.01 kg/m  Height: 5\' 3"  (160 cm) Ht Readings from Last 3 Encounters:  09/19/16 5\' 3"  (1.6 m)  09/09/15 5' 3.25" (1.607 m)  09/07/14 5' 3.25" (1.607 m)    General appearance: alert, cooperative and appears stated age Head: Normocephalic, without obvious abnormality, atraumatic  Neck: no adenopathy, supple, symmetrical, trachea midline and thyroid normal to inspection and palpation Lungs: clear to auscultation bilaterally Breasts: normal appearance, no masses or tenderness, No nipple retraction or dimpling, No nipple discharge or bleeding, No axillary or supraclavicular adenopathy Heart: regular rate and rhythm Abdomen: soft, non-tender; no masses,  no organomegaly Extremities: extremities normal, atraumatic, no cyanosis or edema Skin: Skin color, texture, turgor normal. No rashes or lesions Lymph nodes: Cervical, supraclavicular, and axillary nodes normal. No abnormal inguinal nodes palpated Neurologic: Grossly normal   Pelvic: External genitalia:  no lesions              Urethra:  normal appearing urethra with no masses, tenderness or lesions              Bartholin's and Skene's: normal                 Vagina: normal appearing vagina with normal color and discharge, no lesions               Cervix: no bleeding following Pap, no cervical motion tenderness and no lesions              Pap taken: Yes.   Bimanual Exam:  Uterus:  normal size, contour, position, consistency, mobility, non-tender              Adnexa: normal adnexa and no mass, fullness, tenderness               Rectovaginal: Confirms               Anus:  normal sphincter tone, no lesions  Chaperone present: yes  A:  Well Woman with normal exam  Menopausal no HRT  Hypertension/hypothyroid/cholesterol/Type 2 Diabetes with PCP management  BMD due, has scheduled  P:   Reviewed health and wellness pertinent to exam  Aware of need to advise if vaginal bleeding  Continue follow up with PCP as indicated  Patient will request copy of BMD sent to [place]  Pap smear: yes   counseled on breast self exam, mammography screening, menopause, adequate intake of calcium and vitamin D, diet and exercise.  return annually or prn  An After Visit Summary was printed and given to the patient.

## 2016-09-19 NOTE — Patient Instructions (Signed)

## 2016-09-22 LAB — CYTOLOGY - PAP
Diagnosis: NEGATIVE
HPV: NOT DETECTED

## 2016-12-21 ENCOUNTER — Other Ambulatory Visit: Payer: Self-pay | Admitting: Certified Nurse Midwife

## 2016-12-21 DIAGNOSIS — Z1231 Encounter for screening mammogram for malignant neoplasm of breast: Secondary | ICD-10-CM

## 2016-12-26 ENCOUNTER — Ambulatory Visit
Admission: RE | Admit: 2016-12-26 | Discharge: 2016-12-26 | Disposition: A | Discharge status: Home / Self Care | Payer: Managed Care, Other (non HMO) | Source: Ambulatory Visit | Attending: Certified Nurse Midwife | Admitting: Certified Nurse Midwife

## 2016-12-26 DIAGNOSIS — Z1231 Encounter for screening mammogram for malignant neoplasm of breast: Secondary | ICD-10-CM

## 2017-02-12 ENCOUNTER — Encounter: Payer: Self-pay | Admitting: Internal Medicine

## 2017-02-12 ENCOUNTER — Ambulatory Visit (INDEPENDENT_AMBULATORY_CARE_PROVIDER_SITE_OTHER): Payer: Managed Care, Other (non HMO) | Admitting: Internal Medicine

## 2017-02-12 DIAGNOSIS — G4733 Obstructive sleep apnea (adult) (pediatric): Secondary | ICD-10-CM | POA: Diagnosis not present

## 2017-02-12 DIAGNOSIS — E038 Other specified hypothyroidism: Secondary | ICD-10-CM

## 2017-02-12 NOTE — Patient Instructions (Signed)
Order- DME [facility]    Continue CPAP 14   Mask of choice, humidifier, supplies, AirView   Dx OSA  Please call if we can help

## 2017-02-12 NOTE — Progress Notes (Signed)
02/12/17- 61 year old female never smoker for sleep evaluation. Former [facility] pt. Pt does currently have a CPAP machine. DME: [facility]. NPSG 2005:  AHI 11/hr, +PLMS (intolerant to dopamine agonist).  Medical problem list includes HBP, hypothyroid, anxiety/depression, DM 2 She has been using CPAP 14/[facility] with full face mask (mouth breather). Feels much better off with CPAP. No sleep medicines. Because of her job starting around 4:30 each morning, she gets up at 3:15 AM with bedtime at 9 PM. This will change soon as she retires next month. Occasional naps. Morning coffee only. She doesn't think she snores through her CPAP or that she kicks or has other parasomnias. ENT surgery-tonsils and sinus surgery. Breathes comfortably through her nose. Denies lung or heart problems. This machine is probably for 61 years old. [place] 8  Prior to Admission medications   Medication Sig Start Date End Date Taking? Authorizing Provider  Cholecalciferol (VITAMIN D) 2000 UNITS tablet Take 2,000 Units by mouth daily.   Yes [provider]  lisinopril (PRINIVIL,ZESTRIL) 20 MG tablet Take 20 mg by mouth daily.   Yes [provider]  metFORMIN (GLUCOPHAGE) 500 MG tablet Take 500 mg by mouth daily with breakfast.   Yes [provider]  omeprazole (PRILOSEC) 20 MG capsule Take 20 mg by mouth daily.   Yes [provider]  pravastatin (PRAVACHOL) 40 MG tablet Take 40 mg by mouth daily.   Yes [provider]  sertraline (ZOLOFT) 100 MG tablet Take 100 mg by mouth daily.   Yes [provider]   Past Medical History:  Diagnosis Date  . Abnormal Pap smear of cervix    late 1980's  . Anemia   . Anxiety   . Cancer ([place])    skin  . Depression   . Dysmenorrhea   . Genital warts   . Hypertension   . Thyroid disease    no issues lately  . Urinary incontinence    occ   Past Surgical History:  Procedure Laterality Date  . APPENDECTOMY  1990  . CARPAL TUNNEL RELEASE    .  [place]  . COLON RESECTION     took 18 inches out  . FOOT SURGERY    . ROTATOR CUFF REPAIR  2014  . [place]   calcium deposits  . TONSILLECTOMY  1963  . TUBAL LIGATION     Family History  Problem Relation Age of Onset  . Diabetes Mother   . Hypertension Mother   . Cancer Father        skin  . Hypertension Father   . Diabetes Sister   . Diabetes Brother   . Hypertension Brother   . Heart attack Brother   . Diabetes Maternal Grandmother   . Diabetes Maternal Grandfather   . Stroke Maternal Grandfather   . Diabetes Brother   . Breast cancer Neg Hx    Social History   Social History  . Marital status: Married    Spouse name: N/A  . Number of children: N/A  . Years of education: N/A   Occupational History  . Web designer with [employer]   Social History Main Topics  . Smoking status: Never Smoker  . Smokeless tobacco: Never Used  . Alcohol use 1.2 oz/week    2 Standard drinks or equivalent per week  . Drug use: No  . Sexual activity: Yes    Partners: Male    Birth control/ protection: Surgical, Post-menopausal  Comment: btl   Other Topics Concern  . Not on file   Social History Narrative  . No narrative on file   ROS-see HPI   + = Positive Constitutional:    weight loss, night sweats, fevers, chills, fatigue, lassitude. HEENT:    headaches, difficulty swallowing, tooth/dental problems, sore throat,       sneezing, itching, ear ache, nasal congestion, post nasal drip, snoring CV:    chest pain, orthopnea, PND, swelling in lower extremities, anasarca,                                  dizziness, palpitations Resp:   shortness of breath with exertion or at rest.                productive cough,   non-productive cough, coughing up of blood.              change in color of mucus.  wheezing.   Skin:    rash or lesions. GI:  No-   heartburn, indigestion, abdominal pain, nausea, vomiting, diarrhea,                  change in bowel habits, loss of appetite GU: dysuria, change in color of urine, no urgency or frequency.   flank pain. MS:   joint pain, stiffness, decreased range of motion, back pain. Neuro-     nothing unusual Psych:  change in mood or affect. + depression or anxiety.   memory loss.  OBJ- Physical Exam General- Alert, Oriented, Affect-appropriate, Distress- none acute Skin- rash-none, lesions- none, excoriation- none Lymphadenopathy- none Head- atraumatic            Eyes- Gross vision intact, PERRLA, conjunctivae and secretions clear            Ears- Hearing, canals-normal            Nose- Clear, no-Septal dev, mucus, polyps, erosion, perforation             Throat- Mallampati IV , mucosa clear , drainage- none, tonsils- atrophic, + own teeth Neck- flexible , trachea midline, no stridor , thyroid nl, carotid no bruit Chest - symmetrical excursion , unlabored           Heart/CV- RRR , no murmur , no gallop  , no rub, nl s1 s2                           - JVD- none , edema- none, stasis changes- none, varices- none           Lung- clear to P&A, wheeze- none, cough- none , dullness-none, rub- none           Chest wall-  Abd-  Br/ Gen/ Rectal- Not done, not indicated Extrem- cyanosis- none, clubbing, none, atrophy- none, strength- nl Neuro- grossly intact to observation

## 2017-02-13 NOTE — Assessment & Plan Note (Signed)
She describes excellent control with CPAP and very much wants to continue. When she is eligible for replacement machine we will probably change to auto Pap. She is not aware significant limb movement sleep disturbance at this time. Plan-continue CPAP 14. I think she will be better off if able to sleep later once she retires next month.

## 2017-02-13 NOTE — Assessment & Plan Note (Signed)
Managed by PCP

## 2017-05-07 DIAGNOSIS — M65941 Unspecified synovitis and tenosynovitis, right hand: Secondary | ICD-10-CM | POA: Insufficient documentation

## 2017-05-07 DIAGNOSIS — M79641 Pain in right hand: Secondary | ICD-10-CM | POA: Insufficient documentation

## 2017-05-07 DIAGNOSIS — M659 Synovitis and tenosynovitis, unspecified: Secondary | ICD-10-CM | POA: Insufficient documentation

## 2017-09-25 ENCOUNTER — Other Ambulatory Visit: Payer: Self-pay

## 2017-09-25 ENCOUNTER — Encounter: Payer: Self-pay | Admitting: Certified Nurse Midwife

## 2017-09-25 ENCOUNTER — Telehealth: Payer: Self-pay | Admitting: Certified Nurse Midwife

## 2017-09-25 ENCOUNTER — Ambulatory Visit: Payer: Managed Care, Other (non HMO) | Admitting: Certified Nurse Midwife

## 2017-09-25 VITALS — BP 120/72 | HR 70 | Resp 16 | Ht 63.25 in | Wt 182.0 lb

## 2017-09-25 DIAGNOSIS — Z01411 Encounter for gynecological examination (general) (routine) with abnormal findings: Secondary | ICD-10-CM | POA: Diagnosis not present

## 2017-09-25 DIAGNOSIS — R1031 Right lower quadrant pain: Secondary | ICD-10-CM | POA: Diagnosis not present

## 2017-09-25 DIAGNOSIS — Z780 Asymptomatic menopausal state: Secondary | ICD-10-CM | POA: Diagnosis not present

## 2017-09-25 DIAGNOSIS — N898 Other specified noninflammatory disorders of vagina: Secondary | ICD-10-CM

## 2017-09-25 DIAGNOSIS — R102 Pelvic and perineal pain: Secondary | ICD-10-CM

## 2017-09-25 DIAGNOSIS — G8929 Other chronic pain: Secondary | ICD-10-CM

## 2017-09-25 NOTE — Progress Notes (Signed)
62 y.o. G52P2002 Married  Caucasian Fe here for annual exam.  Menopausal no HRT. Denies vaginal bleeding or vaginal dryness. Using coconut oil prn with good results. Sees PCP Dr. Alyson Ingles for Vitamin D, Cholesterol,hypertension, glucose and anxiety management. All medications stable per patient. Has noted a intermittent pain on RLQ of pelvis that seems to come and go for about the past 2-4 weeks.. No severe pain, but present daily. Denies urinary symptoms, vaginal symptoms, constipation or diarrhea or increase gas, nausea or vomiting.Marland Kitchen Position change does not stimulate or change. Has had appendectomy, so no concerns with this. No urinary frequency, urgency,odor or pain. No pain with sexual activity. Has not seen PCP regarding due to location. Several surgeries abdominally with colon resection, C/section x 2, BTL and appendectomy. No other concerns today. Having with fun with grandchild!  Patient's last menstrual period was 11/01/2011.          Sexually active: Yes.    The current method of family planning is tubal ligation.    Exercising: Yes.    walking, swimming Smoker:  no  Health Maintenance: Pap:  09-07-14 neg, 09-19-16 neg HPV HR neg History of Abnormal Pap: yes MMG:  12-26-16 category b density birads 1:neg Self Breast exams: yes Colonoscopy:  2011 neg f/u 92yrs BMD:   2018 neg per patient-pcp manages TDaP:  UTD with pcp Shingles: no Pneumonia: no Hep C and HIV: not done Labs: no   reports that she has never smoked. She has never used smokeless tobacco. She reports that she drank alcohol. She reports that she does not use drugs.  Past Medical History:  Diagnosis Date  . Abnormal Pap smear of cervix    late 1980's  . Anemia   . Anxiety   . Cancer ([place])    skin  . Depression   . Dysmenorrhea   . Genital warts   . Hypertension   . Thyroid disease    no issues lately  . Urinary incontinence    occ    Past Surgical History:  Procedure Laterality Date  . APPENDECTOMY  1990  .  bone spur removal    . CARPAL TUNNEL RELEASE    . [place]  . COLON RESECTION     took 18 inches out  . FOOT SURGERY    . ROTATOR CUFF REPAIR  2014  . [place]   calcium deposits  . TONSILLECTOMY  1963  . TUBAL LIGATION      Current Outpatient Medications  Medication Sig Dispense Refill  . Cholecalciferol (VITAMIN D) 2000 UNITS tablet Take 2,000 Units by mouth daily.    Marland Kitchen lisinopril (PRINIVIL,ZESTRIL) 20 MG tablet Take 20 mg by mouth daily.    . metFORMIN (GLUCOPHAGE) 500 MG tablet Take 500 mg by mouth daily with breakfast.    . omeprazole (PRILOSEC) 20 MG capsule Take 20 mg by mouth daily.    . pravastatin (PRAVACHOL) 40 MG tablet Take 40 mg by mouth daily.    . sertraline (ZOLOFT) 100 MG tablet Take 100 mg by mouth daily.     No current facility-administered medications for this visit.     Family History  Problem Relation Age of Onset  . Diabetes Mother   . Hypertension Mother   . Cancer Father        skin  . Hypertension Father   . Diabetes Sister   . Diabetes Brother   . Hypertension Brother   . Heart attack Brother   .  Diabetes Maternal Grandmother   . Diabetes Maternal Grandfather   . Stroke Maternal Grandfather   . Diabetes Brother   . Breast cancer Neg Hx     ROS:  Pertinent items are noted in HPI.  Otherwise, a comprehensive ROS was negative.  Exam:   BP 120/72   Pulse 70   Resp 16   Ht 5' 3.25" (1.607 m)   Wt 182 lb (82.6 kg)   LMP 11/01/2011   BMI 31.99 kg/m  Height: 5' 3.25" (160.7 cm) Ht Readings from Last 3 Encounters:  09/25/17 5' 3.25" (1.607 m)  02/12/17 5\' 2"  (1.575 m)  09/19/16 5\' 3"  (1.6 m)    General appearance: alert, cooperative and appears stated age Head: Normocephalic, without obvious abnormality, atraumatic Neck: no adenopathy, supple, symmetrical, trachea midline and thyroid normal to inspection and palpation Lungs: clear to auscultation bilaterally Breasts: normal appearance, no masses  or tenderness, No nipple retraction or dimpling, No nipple discharge or bleeding, No axillary or supraclavicular adenopathy Heart: regular rate and rhythm Abdomen: soft, non-tender; no masses,  no organomegaly, in all 4 quadrants, no rebound, transverse and vertical scarring noted on abdomen, from surgeries. Extremities: extremities normal, atraumatic, no cyanosis or edema Skin: Skin color, texture, turgor normal. No rashes or lesions. Skin warm and dry Lymph nodes: Cervical, supraclavicular, and axillary nodes normal. No abnormal inguinal nodes palpated Neurologic: Grossly normal   Pelvic: External genitalia:  no lesions, normal female               Urethra:  normal appearing urethra with no masses, tenderness or lesions  Bladder, urethral meatus non tender              Bartholin's and Skene's: normal                 Vagina: normal appearing vagina with normal color and discharge, no lesions              Cervix: no cervical motion tenderness, no lesions and normal appearance              Pap taken: No. Bimanual Exam:  Uterus:  normal size, contour, position, consistency, mobility, non-tender              Adnexa: Normal feel on left, no rebound or mass noted. Right slight fullness noted with slight pain with deep palpation. Adnexa not palpated on right.              Rectovaginal: Confirms               Anus:  normal sphincter tone, no lesions  Chaperone present: yes  A:  Well Woman with normal exam  Menopausal no HRT  Vaginal dryness  RLQ pelvic pain with slight fullness only noted on palpation with 2-4 week duration, vaginal dryness  Normal abdominal exam  Previous abdominal surgeries ? Adhesions  Anxiety, cholesterol, glucose and hypertension with PCP management    P:   Reviewed health and wellness pertinent to exam  Aware of need to advise if vaginal bleeding  Continue coconut oil use for dryness  Discussed PUS here to evaluate pain and rule not female related. Warning signs of  pelvic and abdominal pain given orally and printed. Non tender in bladder or bowel area or vaginal area discussed. Patient agreeable to PUS. She is aware she will be called with insurance information and scheduled.  Continue follow up with PCP as indicated.  Pap smear: no   counseled on breast  self exam, mammography screening, feminine hygiene, adequate intake of calcium and vitamin D, diet and exercise  return annually or prn  An After Visit Summary was printed and given to the patient.

## 2017-09-25 NOTE — Patient Instructions (Signed)
EXERCISE AND DIET:  We recommended that you start or continue a regular exercise program for good health. Regular exercise means any activity that makes your heart beat faster and makes you sweat.  We recommend exercising at least 30 minutes per day at least 3 days a week, preferably 4 or 5.  We also recommend a diet low in fat and sugar.  Inactivity, poor dietary choices and obesity can cause diabetes, heart attack, stroke, and kidney damage, among others.    ALCOHOL AND SMOKING:  Women should limit their alcohol intake to no more than 7 drinks/beers/glasses of wine (combined, not each!) per week. Moderation of alcohol intake to this level decreases your risk of breast cancer and liver damage. And of course, no recreational drugs are part of a healthy lifestyle.  And absolutely no smoking or even second hand smoke. Most people know smoking can cause heart and lung diseases, but did you know it also contributes to weakening of your bones? Aging of your skin?  Yellowing of your teeth and nails?  CALCIUM AND VITAMIN D:  Adequate intake of calcium and Vitamin D are recommended.  The recommendations for exact amounts of these supplements seem to change often, but generally speaking 600 mg of calcium (either carbonate or citrate) and 800 units of Vitamin D per day seems prudent. Certain women may benefit from higher intake of Vitamin D.  If you are among these women, your doctor will have told you during your visit.    PAP SMEARS:  Pap smears, to check for cervical cancer or precancers,  have traditionally been done yearly, although recent scientific advances have shown that most women can have pap smears less often.  However, every woman still should have a physical exam from her gynecologist every year. It will include a breast check, inspection of the vulva and vagina to check for abnormal growths or skin changes, a visual exam of the cervix, and then an exam to evaluate the size and shape of the uterus and  ovaries.  And after 60 years of age, a rectal exam is indicated to check for rectal cancers. We will also provide age appropriate advice regarding health maintenance, like when you should have certain vaccines, screening for sexually transmitted diseases, bone density testing, colonoscopy, mammograms, etc.   MAMMOGRAMS:  All women over 40 years old should have a yearly mammogram. Many facilities now offer a "3D" mammogram, which may cost around $50 extra out of pocket. If possible,  we recommend you accept the option to have the 3D mammogram performed.  It both reduces the number of women who will be called back for extra views which then turn out to be normal, and it is better than the routine mammogram at detecting truly abnormal areas.    COLONOSCOPY:  Colonoscopy to screen for colon cancer is recommended for all women at age 50.  We know, you hate the idea of the prep.  We agree, BUT, having colon cancer and not knowing it is worse!!  Colon cancer so often starts as a polyp that can be seen and removed at colonscopy, which can quite literally save your life!  And if your first colonoscopy is normal and you have no family history of colon cancer, most women don't have to have it again for 10 years.  Once every ten years, you can do something that may end up saving your life, right?  We will be happy to help you get it scheduled when you are ready.    Be sure to check your insurance coverage so you understand how much it will cost.  It may be covered as a preventative service at no cost, but you should check your particular policy.      Pelvic Pain, Female Pelvic pain is pain in your lower abdomen, below your belly button and between your hips. The pain may start suddenly (acute), keep coming back (recurring), or last a long time (chronic). Pelvic pain that lasts longer than six months is considered chronic. Pelvic pain may affect your:  Reproductive organs.  Urinary system.  Digestive  tract.  Musculoskeletal system.  There are many potential causes of pelvic pain. Sometimes, the pain can be a result of digestive or urinary conditions, strained muscles or ligaments, or even reproductive conditions. Sometimes the cause of pelvic pain is not known. Follow these instructions at home:  Take over-the-counter and prescription medicines only as told by your health care provider.  Rest as told by your health care provider.  Do not have sex it if hurts.  Keep a journal of your pelvic pain. Write down: ? When the pain started. ? Where the pain is located. ? What seems to make the pain better or worse, such as food or your menstrual cycle. ? Any symptoms you have along with the pain.  Keep all follow-up visits as told by your health care provider. This is important. Contact a health care provider if:  Medicine does not help your pain.  Your pain comes back.  You have new symptoms.  You have abnormal vaginal discharge or bleeding, including bleeding after menopause.  You have a fever or chills.  You are constipated.  You have blood in your urine or stool.  You have foul-smelling urine.  You feel weak or lightheaded. Get help right away if:  You have sudden severe pain.  Your pain gets steadily worse.  You have severe pain along with fever, nausea, vomiting, or excessive sweating.  You lose consciousness. This information is not intended to replace advice given to you by your health care provider. Make sure you discuss any questions you have with your health care provider. Document Released: 02/29/2004 Document Revised: 04/28/2015 Document Reviewed: 01/22/2015 Elsevier Interactive Patient Education  2018 [employer].

## 2017-09-25 NOTE — Telephone Encounter (Signed)
Patient returned call. Spoke with patient regarding benefit for an ultrasound.  Patient understood and agreeable. Patient ready to schedule. Patient scheduled 10/04/17 with Dr Sabra Heck. Patient declined earlier appointment dates, due to previously scheduled appointment. Patient is aware of appointment date, arrival rime and cancellation policy. Patient had no further questions.   Routing to provider for final review. Patient agreeable to disposition. Will close encounter.  Routing to Dr Sabra Heck  cc: Melvia Heaps, CNM

## 2017-09-25 NOTE — Telephone Encounter (Signed)
Call placed to patient to review benefits and scheduled recommended ulrasound. Left voicemail message requesting a return call.

## 2017-10-04 ENCOUNTER — Encounter: Payer: Self-pay | Admitting: Obstetrics & Gynecology

## 2017-10-04 ENCOUNTER — Ambulatory Visit (INDEPENDENT_AMBULATORY_CARE_PROVIDER_SITE_OTHER): Payer: Managed Care, Other (non HMO)

## 2017-10-04 ENCOUNTER — Ambulatory Visit (INDEPENDENT_AMBULATORY_CARE_PROVIDER_SITE_OTHER): Payer: Managed Care, Other (non HMO) | Admitting: Obstetrics & Gynecology

## 2017-10-04 ENCOUNTER — Other Ambulatory Visit: Payer: Self-pay | Admitting: *Deleted

## 2017-10-04 VITALS — BP 112/70 | HR 72 | Resp 14 | Ht 63.25 in | Wt 180.0 lb

## 2017-10-04 DIAGNOSIS — D251 Intramural leiomyoma of uterus: Secondary | ICD-10-CM

## 2017-10-04 DIAGNOSIS — D252 Subserosal leiomyoma of uterus: Secondary | ICD-10-CM | POA: Diagnosis not present

## 2017-10-04 DIAGNOSIS — G8929 Other chronic pain: Secondary | ICD-10-CM | POA: Diagnosis not present

## 2017-10-04 DIAGNOSIS — R1031 Right lower quadrant pain: Secondary | ICD-10-CM | POA: Diagnosis not present

## 2017-10-04 DIAGNOSIS — N393 Stress incontinence (female) (male): Secondary | ICD-10-CM

## 2017-10-04 DIAGNOSIS — R110 Nausea: Secondary | ICD-10-CM | POA: Diagnosis not present

## 2017-10-04 DIAGNOSIS — R197 Diarrhea, unspecified: Secondary | ICD-10-CM | POA: Diagnosis not present

## 2017-10-04 NOTE — Progress Notes (Signed)
62 y.o. G26P2002 Married Caucasian female here for pelvic ultrasound due to RLQ pain.  Pt has present for the last two to three months.  Every day she feels it.  Pain is not sharp but is a dull ache.  Does seem to be sharper when she moves.  She does not associate this with any bladder changes.  She does have some urinary leakage with cough or strain but this isn't really new.  She is having some intermittent nausea (no emesis) as well as diarrhea.    Having increased nausea and intermittent diarrhea.    Last colonoscopy was 2011.  This was negative.    She does have a hx of appendectomy in 1990 and cesarean sections in 1984 and 1988.  Denies vaginal bleeding or vaginal discharge.  Had abdominal ultrasound 09/15/16 that was negative except for fatty liver.  Patient's last menstrual period was 11/01/2011.  Contraception: BTL  Findings:  UTERUS: 6.3 x 4.0 x 3.0cm with a few small fibroids measuring 1cm to 1.4cm.  These are intramural and subserosal EMS:3.87mm ADNEXA: Left ovary: 1.7 x 0.8 x 1.cm       Right ovary: 2.0 x 1.9 x 1.1cm CUL DE SAC: no free fluid  Physical Exam  Constitutional: She is oriented to person, place, and time. She appears well-developed and well-nourished.  GI: Soft. Bowel sounds are normal. She exhibits no distension and no mass. There is tenderness (RLQ tenderness to palpation). There is no rebound and no guarding.  Genitourinary: Vagina normal. There is no rash, tenderness, lesion or injury on the right labia. There is no rash, tenderness, lesion or injury on the left labia.  Genitourinary Comments: Tenderness present to palpation in RLQ.  Not pelvic floor pain.  Cannot exactly isolate pain but it is present.    Lymphadenopathy:       Right: No inguinal adenopathy present.       Left: No inguinal adenopathy present.  Neurological: She is alert and oriented to person, place, and time.  Skin: Skin is warm and dry.  Psychiatric: She has a normal mood and affect.    Discussion:  Findings reviewed with pt.  With no specific findings on ultrasound except fibroids, feel should proceed with additional work up.  We may need to get GI involved as well.  Will check CMET for renal function and urine culture as well due to the urinary incontinence.  Pt comfortable with plan.  Assessment:   RLQ pain Uterine fibroids SUI Fatty liver on ultrasound 09/15/16  Plan:  Will see if can get CT precerted for this pt.  If not, will refer to GI. CMP and urine culture also pending.  ~25 minutes spent with patient >50% of time was in face to face discussion of above.

## 2017-10-05 LAB — COMPREHENSIVE METABOLIC PANEL
ALT: 40 IU/L — ABNORMAL HIGH (ref 0–32)
AST: 31 IU/L (ref 0–40)
Albumin/Globulin Ratio: 1.8 (ref 1.2–2.2)
Albumin: 4.9 g/dL (ref 3.5–5.5)
Alkaline Phosphatase: 58 IU/L (ref 39–117)
BUN/Creatinine Ratio: 18 (ref 9–23)
BUN: 14 mg/dL (ref 6–24)
Bilirubin Total: 0.4 mg/dL (ref 0.0–1.2)
CO2: 25 mmol/L (ref 20–29)
Calcium: 10.5 mg/dL — ABNORMAL HIGH (ref 8.7–10.2)
Chloride: 100 mmol/L (ref 96–106)
Creatinine, Ser: 0.8 mg/dL (ref 0.57–1.00)
GFR calc Af Amer: 93 mL/min/{1.73_m2} (ref >59)
GFR calc non Af Amer: 81 mL/min/{1.73_m2} (ref >59)
Globulin, Total: 2.7 g/dL (ref 1.5–4.5)
Glucose: 78 mg/dL (ref 65–99)
Potassium: 4.6 mmol/L (ref 3.5–5.2)
Sodium: 142 mmol/L (ref 134–144)
Total Protein: 7.6 g/dL (ref 6.0–8.5)

## 2017-10-05 LAB — URINE CULTURE

## 2017-10-10 ENCOUNTER — Encounter: Payer: Self-pay | Admitting: Obstetrics & Gynecology

## 2017-10-10 DIAGNOSIS — D251 Intramural leiomyoma of uterus: Secondary | ICD-10-CM | POA: Insufficient documentation

## 2017-10-10 DIAGNOSIS — D252 Subserosal leiomyoma of uterus: Secondary | ICD-10-CM

## 2017-10-11 ENCOUNTER — Other Ambulatory Visit: Payer: Self-pay | Admitting: *Deleted

## 2017-10-11 DIAGNOSIS — R1031 Right lower quadrant pain: Secondary | ICD-10-CM

## 2017-10-11 DIAGNOSIS — R197 Diarrhea, unspecified: Secondary | ICD-10-CM

## 2017-10-11 DIAGNOSIS — D252 Subserosal leiomyoma of uterus: Secondary | ICD-10-CM

## 2017-10-11 DIAGNOSIS — G8929 Other chronic pain: Secondary | ICD-10-CM

## 2017-10-11 DIAGNOSIS — D251 Intramural leiomyoma of uterus: Secondary | ICD-10-CM

## 2017-10-11 DIAGNOSIS — R110 Nausea: Secondary | ICD-10-CM

## 2017-10-17 ENCOUNTER — Ambulatory Visit
Admission: RE | Admit: 2017-10-17 | Discharge: 2017-10-17 | Disposition: A | Discharge status: Home / Self Care | Payer: Managed Care, Other (non HMO) | Source: Ambulatory Visit | Attending: Obstetrics & Gynecology | Admitting: Obstetrics & Gynecology

## 2017-10-17 DIAGNOSIS — G8929 Other chronic pain: Secondary | ICD-10-CM

## 2017-10-17 DIAGNOSIS — R1031 Right lower quadrant pain: Principal | ICD-10-CM

## 2017-10-17 DIAGNOSIS — R197 Diarrhea, unspecified: Secondary | ICD-10-CM

## 2017-10-17 DIAGNOSIS — D252 Subserosal leiomyoma of uterus: Secondary | ICD-10-CM

## 2017-10-17 DIAGNOSIS — D251 Intramural leiomyoma of uterus: Secondary | ICD-10-CM

## 2017-10-17 DIAGNOSIS — R110 Nausea: Secondary | ICD-10-CM

## 2017-10-17 MED ORDER — IOHEXOL 300 MG/ML  SOLN
100.0000 mL | Freq: Once | INTRAMUSCULAR | Status: AC | PRN
Start: 2017-10-17 — End: 2017-10-17
  Administered 2017-10-17: 100 mL via INTRAVENOUS

## 2017-10-22 ENCOUNTER — Telehealth: Payer: Self-pay | Admitting: Obstetrics & Gynecology

## 2017-10-22 NOTE — Telephone Encounter (Signed)
Dr. Sabra Heck -please review CT scan dated 10/17/17 and advise.

## 2017-10-22 NOTE — Telephone Encounter (Signed)
Patient is asking if Dr.Miller has had a chance to review her CT scan from last week?

## 2017-10-24 NOTE — Telephone Encounter (Signed)
Spoke with patient, advised as seen below per Dr. Sabra Heck. Patient declines GI referral at this time. Patient states the pain is intermittent, would like to continue to monitor. Advised patient return call to office if she wants to proceed with GI referral. Patient verbalizes understanding.   Routing to provider for final review. Patient is agreeable to disposition. Will close encounter.

## 2017-10-24 NOTE — Telephone Encounter (Signed)
Please let her know her CT scan is completely negative except that she is a very small umbilical hernia that has a small amount of fat in it.  This would not cause her RLQ pain.  I would like to know if she would like to see GI as there is no abnormality on the CT and her only gyn finding was fibroids which would also not cause the pain.  Thanks.

## 2017-11-13 ENCOUNTER — Other Ambulatory Visit: Payer: Managed Care, Other (non HMO)

## 2018-02-12 ENCOUNTER — Ambulatory Visit: Payer: Managed Care, Other (non HMO) | Admitting: Internal Medicine

## 2018-02-13 ENCOUNTER — Other Ambulatory Visit: Payer: Self-pay | Admitting: Certified Nurse Midwife

## 2018-02-13 DIAGNOSIS — Z1231 Encounter for screening mammogram for malignant neoplasm of breast: Secondary | ICD-10-CM

## 2018-02-19 ENCOUNTER — Ambulatory Visit
Admission: RE | Admit: 2018-02-19 | Discharge: 2018-02-19 | Disposition: A | Discharge status: Home / Self Care | Payer: Managed Care, Other (non HMO) | Source: Ambulatory Visit | Attending: Certified Nurse Midwife | Admitting: Certified Nurse Midwife

## 2018-02-19 DIAGNOSIS — Z1231 Encounter for screening mammogram for malignant neoplasm of breast: Secondary | ICD-10-CM

## 2018-02-25 ENCOUNTER — Encounter: Payer: Self-pay | Admitting: Internal Medicine

## 2018-02-25 ENCOUNTER — Ambulatory Visit (INDEPENDENT_AMBULATORY_CARE_PROVIDER_SITE_OTHER): Payer: Managed Care, Other (non HMO) | Admitting: Internal Medicine

## 2018-02-25 VITALS — BP 122/80 | HR 77 | Ht 62.0 in | Wt 185.2 lb

## 2018-02-25 DIAGNOSIS — G4733 Obstructive sleep apnea (adult) (pediatric): Secondary | ICD-10-CM | POA: Diagnosis not present

## 2018-02-25 DIAGNOSIS — I10 Essential (primary) hypertension: Secondary | ICD-10-CM

## 2018-02-25 DIAGNOSIS — Z23 Encounter for immunization: Secondary | ICD-10-CM

## 2018-02-25 NOTE — Progress Notes (Signed)
02/12/17- 62 year old female never smoker for sleep evaluation. Former [facility] pt. Pt does currently have a CPAP machine. DME: [facility]. NPSG 2005:  AHI 11/hr, +PLMS (intolerant to dopamine agonist).  Medical problem list includes HBP, hypothyroid, anxiety/depression, DM 2 She has been using CPAP 14/[employer] with full face mask (mouth breather). Feels much better off with CPAP. No sleep medicines. Because of her job starting around 4:30 each morning, she gets up at 3:15 AM with bedtime at 9 PM. This will change soon as she retires next month. Occasional naps. Morning coffee only. She doesn't think she snores through her CPAP or that she kicks or has other parasomnias. ENT surgery-tonsils and sinus surgery. Breathes comfortably through her nose. Denies lung or heart problems. This machine is probably for 60 years old. [place] 8  02/25/2018-62 year old female never smoker followed for OSA, complicated by HBP, hypothyroid, anxiety/depression, DM 2 CPAP 14/[employer]>> today replace old machine auto 10-20 -----States her cpap has been working well. No new concerns.  Download 99% compliance AHI 1.3/hour She feels she is doing very well with CPAP-sleeping better.  Needs replacement download card.  This machine is getting old and we discussed changing to AutoPap Needs flu shot.  She is trying to walk more for exercise.  ROS-see HPI   + = Positive Constitutional:    weight loss, night sweats, fevers, chills, fatigue, lassitude. HEENT:    headaches, difficulty swallowing, tooth/dental problems, sore throat,       sneezing, itching, ear ache, nasal congestion, post nasal drip, snoring CV:    chest pain, orthopnea, PND, swelling in lower extremities, anasarca,                                  dizziness, palpitations Resp:   shortness of breath with exertion or at rest.                productive cough,   non-productive cough, coughing up of blood.              change in color of mucus.  wheezing.   Skin:    rash or  lesions. GI:  No-   heartburn, indigestion, abdominal pain, nausea, vomiting, diarrhea,                 change in bowel habits, loss of appetite GU: dysuria, change in color of urine, no urgency or frequency.   flank pain. MS:   joint pain, stiffness, decreased range of motion, back pain. Neuro-     nothing unusual Psych:  change in mood or affect. + depression or anxiety.   memory loss.  OBJ- Physical Exam General- Alert, Oriented, Affect-appropriate, Distress- none acute Skin- rash-none, lesions- none, excoriation- none Lymphadenopathy- none Head- atraumatic            Eyes- Gross vision intact, PERRLA, conjunctivae and secretions clear            Ears- Hearing, canals-normal            Nose- Clear, no-Septal dev, mucus, polyps, erosion, perforation             Throat- Mallampati IV , mucosa clear , drainage- none, tonsils- atrophic, + own teeth Neck- flexible , trachea midline, no stridor , thyroid nl, carotid no bruit Chest - symmetrical excursion , unlabored           Heart/CV- RRR , no murmur , no gallop  , no rub,  nl s1 s2                           - JVD- none , edema- none, stasis changes- none, varices- none           Lung- clear to P&A, wheeze- none, cough- none , dullness-none, rub- none           Chest wall-  Abd-  Br/ Gen/ Rectal- Not done, not indicated Extrem- cyanosis- none, clubbing, none, atrophy- none, strength- nl Neuro- grossly intact to observation

## 2018-02-25 NOTE — Patient Instructions (Signed)
Order- flu vax standard  Order- DME [facility]- please replace old CPAP machine, changing to auto 10-20, mask of choice, humidifier, supplies, AirView Please provide new download card for her current machine

## 2018-04-18 NOTE — Assessment & Plan Note (Signed)
Doing very well with CPAP, benefits with improved sleep.  Old machine due for replacement and I would like to change to AutoPap. Plan-continue CPAP, replacing old machine with AutoPap 10-20

## 2018-04-18 NOTE — Assessment & Plan Note (Signed)
BP is well controlled at this visit.  We discussed potential medical complications of untreated OSA including cardiac arrhythmias, HBP and cardiovascular problems.

## 2018-09-25 NOTE — Progress Notes (Signed)
63 y.o. G28P2002 Married  Caucasian Fe here for annual exam. Post menopausal no HRT. Denies vaginal bleeding or vaginal dryness. Sees PCP Dr. Alyson Ingles for hypertension,diabetes,cholesterol and anxiety management. Aex due, not scheduled yet. Staying active with swimming and walking. No health issues today.  Patient's last menstrual period was 11/01/2011.          Sexually active: Yes.    The current method of family planning is tubal ligation.    Exercising: Yes.    walking,pool Smoker:  no  Review of Systems  Constitutional: Negative.   HENT: Negative.   Eyes: Negative.   Respiratory: Negative.   Cardiovascular: Negative.   Gastrointestinal: Negative.   Genitourinary: Negative.   Musculoskeletal: Negative.   Skin: Negative.   Neurological: Negative.   Endo/Heme/Allergies: Negative.   Psychiatric/Behavioral: Negative.     Health Maintenance: Pap:  09-19-16 neg HPV HR neg History of Abnormal Pap: yes MMG:  02-19-18 category b density birads 1:neg Self Breast exams: no Colonoscopy:  06/2018  BMD:   2018 neg per patient-pcp manages TDaP:  UTD with pcp Shingles: not done Pneumonia: not done Hep C and HIV: not done Labs: PCP   reports that she has never smoked. She has never used smokeless tobacco. She reports previous alcohol use. She reports that she does not use drugs.  Past Medical History:  Diagnosis Date  . Abnormal Pap smear of cervix    late 1980's  . Anemia   . Anxiety   . Cancer ([place])    skin  . Depression   . Dysmenorrhea   . Genital warts   . Hypertension   . Thyroid disease    no issues lately  . Urinary incontinence    occ    Past Surgical History:  Procedure Laterality Date  . APPENDECTOMY  1990  . bone spur removal    . CARPAL TUNNEL RELEASE    . [place]  . COLON RESECTION     took 18 inches out  . FOOT SURGERY    . ROTATOR CUFF REPAIR  2014  . [place]   calcium deposits  . TONSILLECTOMY  1963  . TUBAL  LIGATION      Current Outpatient Medications  Medication Sig Dispense Refill  . Cholecalciferol (VITAMIN D) 2000 UNITS tablet Take 2,000 Units by mouth daily.    Marland Kitchen lisinopril (PRINIVIL,ZESTRIL) 20 MG tablet Take 20 mg by mouth daily.    . metFORMIN (GLUCOPHAGE) 500 MG tablet Take 500 mg by mouth daily with breakfast.    . pravastatin (PRAVACHOL) 40 MG tablet Take 40 mg by mouth daily.    . sertraline (ZOLOFT) 100 MG tablet Take 100 mg by mouth daily.     No current facility-administered medications for this visit.     Family History  Problem Relation Age of Onset  . Diabetes Mother   . Hypertension Mother   . Cancer Father        skin  . Hypertension Father   . Diabetes Sister   . Diabetes Brother   . Hypertension Brother   . Heart attack Brother   . Diabetes Maternal Grandmother   . Diabetes Maternal Grandfather   . Stroke Maternal Grandfather   . Diabetes Brother   . Breast cancer Neg Hx     ROS:  Pertinent items are noted in HPI.  Otherwise, a comprehensive ROS was negative.  Exam:   BP 120/80   Pulse 68   Temp  97.6 F (36.4 C) (Skin)   Resp 16   Ht 5' 3.25" (1.607 m)   Wt 185 lb (83.9 kg)   LMP 11/01/2011   BMI 32.51 kg/m  Height: 5' 3.25" (160.7 cm) Ht Readings from Last 3 Encounters:  09/27/18 5' 3.25" (1.607 m)  02/25/18 5\' 2"  (1.575 m)  10/04/17 5' 3.25" (1.607 m)    General appearance: alert, cooperative and appears stated age Head: Normocephalic, without obvious abnormality, atraumatic Neck: no adenopathy, supple, symmetrical, trachea midline and thyroid normal to inspection and palpation Lungs: clear to auscultation bilaterally Breasts: normal appearance, no masses or tenderness, No nipple retraction or dimpling, No nipple discharge or bleeding, No axillary or supraclavicular adenopathy Heart: regular rate and rhythm Abdomen: soft, non-tender; no masses,  no organomegaly Extremities: extremities normal, atraumatic, no cyanosis or edema Skin: Skin  color, texture, turgor normal. No rashes or lesions Lymph nodes: Cervical, supraclavicular, and axillary nodes normal. No abnormal inguinal nodes palpated Neurologic: Grossly normal   Pelvic: External genitalia:  no lesions              Urethra:  normal appearing urethra with no masses, tenderness or lesions              Bartholin's and Skene's: normal                 Vagina: normal appearing vagina with normal color and discharge, no lesions              Cervix: no cervical motion tenderness, no lesions and normal appearance              Pap taken: No. Bimanual Exam:  Uterus:  normal size, contour, position, consistency, mobility, non-tender and anteverted              Adnexa: normal adnexa and no mass, fullness, tenderness               Rectovaginal: Confirms               Anus:  normal sphincter tone, no lesions  Chaperone present: yes  A:  Well Woman with normal exam  Post menopausal no HRT  Hypertension,glucose, Vitamin d, cholesterol management with PCP.  P:   Reviewed health and wellness pertinent to exam  Aware of need to advise if vaginal bleeding  Continue follow up with PCP as indicated.  Pap smear: no   counseled on breast self exam, mammography screening, adequate intake of calcium and vitamin D, diet and exercise, Kegel's exercises  return annually or prn  An After Visit Summary was printed and given to the patient.

## 2018-09-27 ENCOUNTER — Other Ambulatory Visit: Payer: Self-pay

## 2018-09-27 ENCOUNTER — Ambulatory Visit (INDEPENDENT_AMBULATORY_CARE_PROVIDER_SITE_OTHER): Payer: Managed Care, Other (non HMO) | Admitting: Certified Nurse Midwife

## 2018-09-27 ENCOUNTER — Encounter: Payer: Self-pay | Admitting: Certified Nurse Midwife

## 2018-09-27 VITALS — BP 120/80 | HR 68 | Temp 97.6°F | Resp 16 | Ht 63.25 in | Wt 185.0 lb

## 2018-09-27 DIAGNOSIS — Z01419 Encounter for gynecological examination (general) (routine) without abnormal findings: Secondary | ICD-10-CM | POA: Diagnosis not present

## 2018-11-01 ENCOUNTER — Other Ambulatory Visit: Payer: Self-pay

## 2018-11-01 DIAGNOSIS — Z20822 Contact with and (suspected) exposure to covid-19: Secondary | ICD-10-CM

## 2018-11-04 ENCOUNTER — Other Ambulatory Visit: Payer: Self-pay | Admitting: Family Medicine

## 2018-11-04 DIAGNOSIS — Z1231 Encounter for screening mammogram for malignant neoplasm of breast: Secondary | ICD-10-CM

## 2018-11-04 DIAGNOSIS — E2839 Other primary ovarian failure: Secondary | ICD-10-CM

## 2018-11-05 LAB — NOVEL CORONAVIRUS, NAA: SARS-CoV-2, NAA: NOT DETECTED

## 2019-02-24 ENCOUNTER — Ambulatory Visit
Admission: RE | Admit: 2019-02-24 | Discharge: 2019-02-24 | Disposition: A | Discharge status: Home / Self Care | Payer: Managed Care, Other (non HMO) | Source: Ambulatory Visit | Attending: Family Medicine | Admitting: Family Medicine

## 2019-02-24 ENCOUNTER — Other Ambulatory Visit: Payer: Self-pay

## 2019-02-24 DIAGNOSIS — E2839 Other primary ovarian failure: Secondary | ICD-10-CM

## 2019-02-24 DIAGNOSIS — Z1231 Encounter for screening mammogram for malignant neoplasm of breast: Secondary | ICD-10-CM

## 2019-02-25 DIAGNOSIS — M25562 Pain in left knee: Secondary | ICD-10-CM | POA: Insufficient documentation

## 2019-03-25 IMAGING — CT CT ABD-PELV W/ CM
2 of 5 series · 14 of 46 positions shown, 16 images · IV contrast (omnipaque)
Comparison: Prior CT from 01/13/2005.

CLINICAL DATA: Initial evaluation for acute generalized chronic
right lower quadrant abdominal pain.

EXAM:
CT ABDOMEN AND PELVIS WITH CONTRAST
TECHNIQUE: Multidetector CT imaging of the abdomen and pelvis was performed
using the standard protocol following bolus administration of
intravenous contrast.
CONTRAST:  100mL OMNIPAQUE IOHEXOL 300 MG/ML  SOLN

[Series 2: abd pelvis 5.00 br40 s3 ax · axial · 0.58mm/px · z∈[+1152,+1547]mm · 11 of 89 slices shown, 13 images]
[im 5/89  soft-tissue]
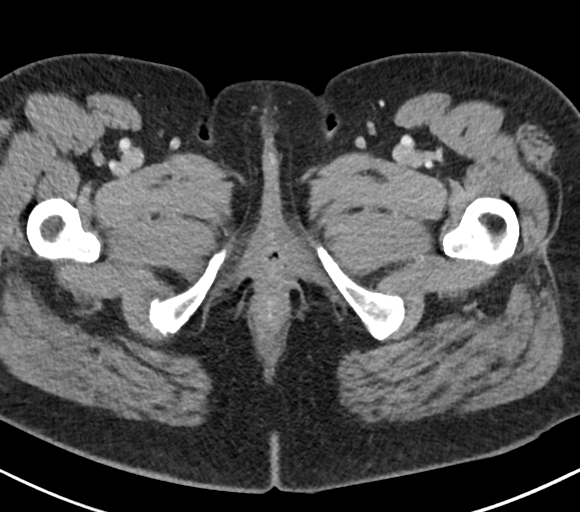
[im 5/89  bone]
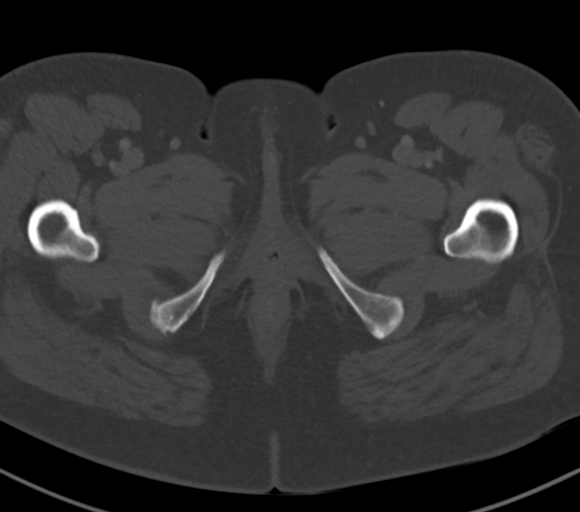
[im 14/89  soft-tissue]
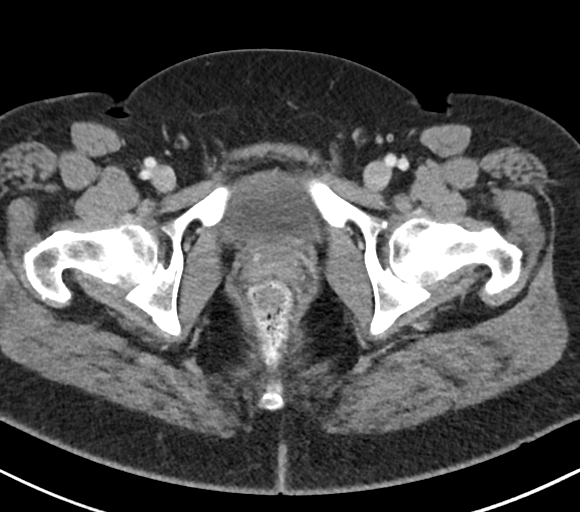
[im 23/89  soft-tissue]
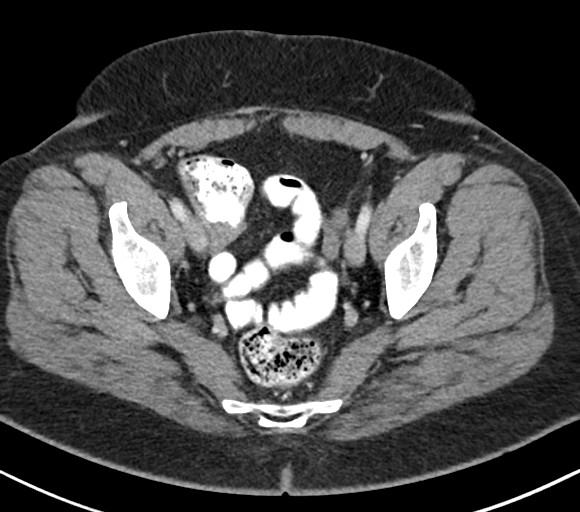
[im 31/89  soft-tissue]
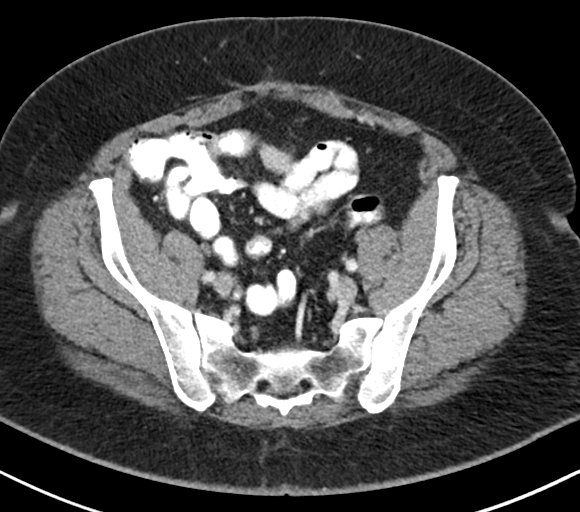
[im 36/89  soft-tissue]
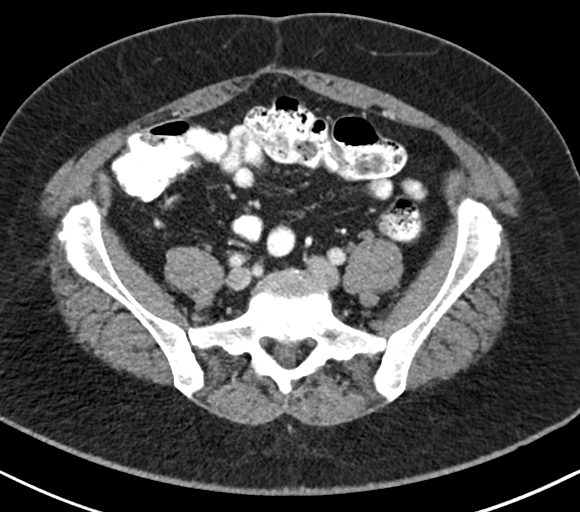
[im 45/89  soft-tissue]
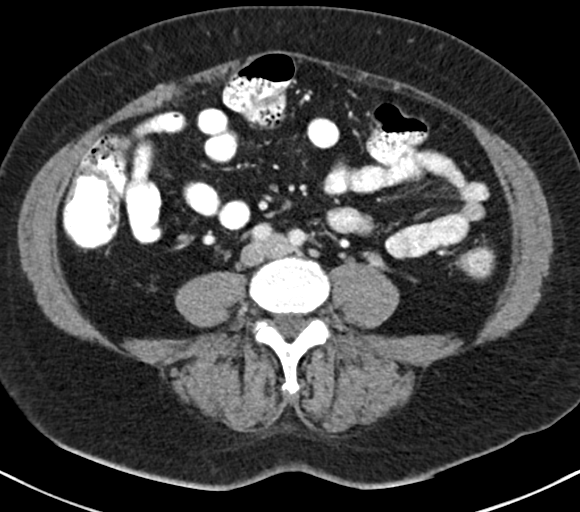
[im 53/89  soft-tissue]
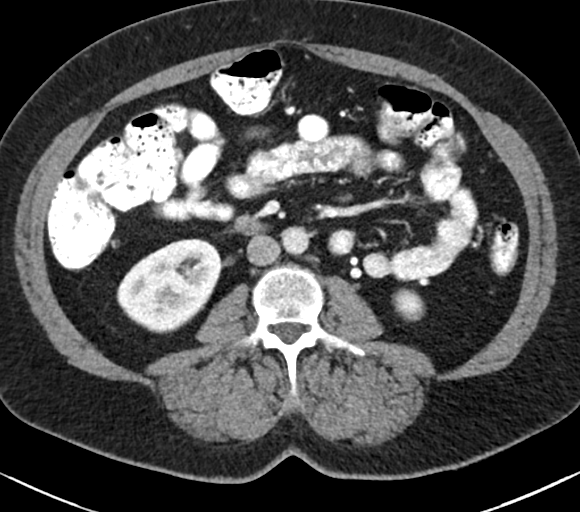
[im 58/89  soft-tissue]
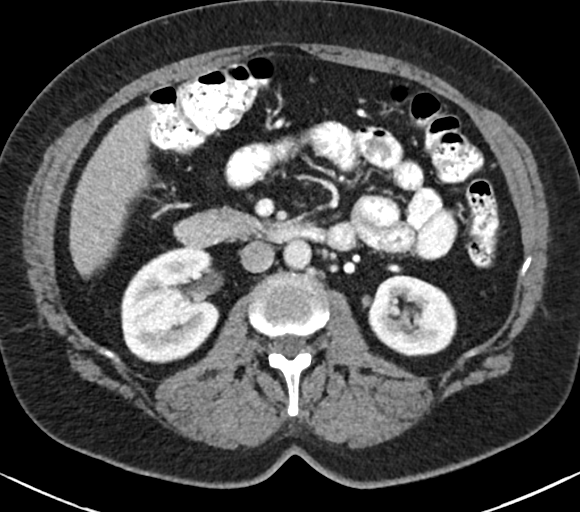
[im 67/89  soft-tissue]
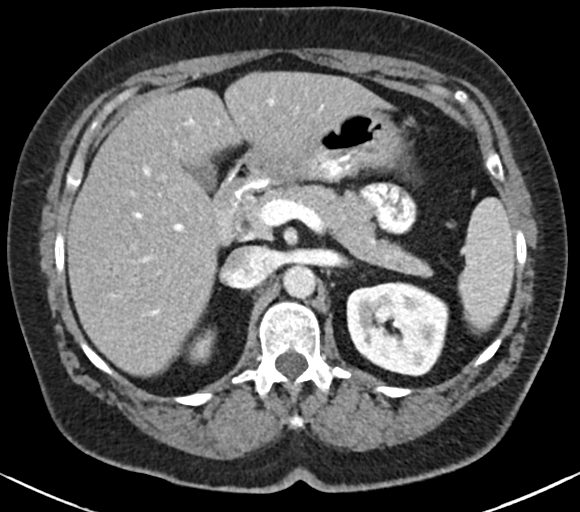
[im 67/89  bone]
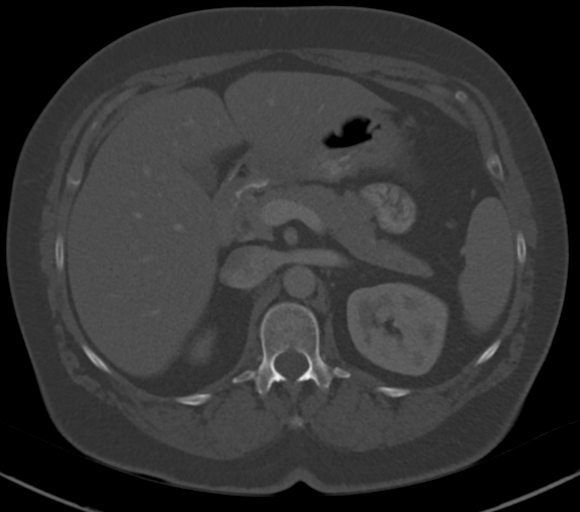
[im 75/89  soft-tissue]
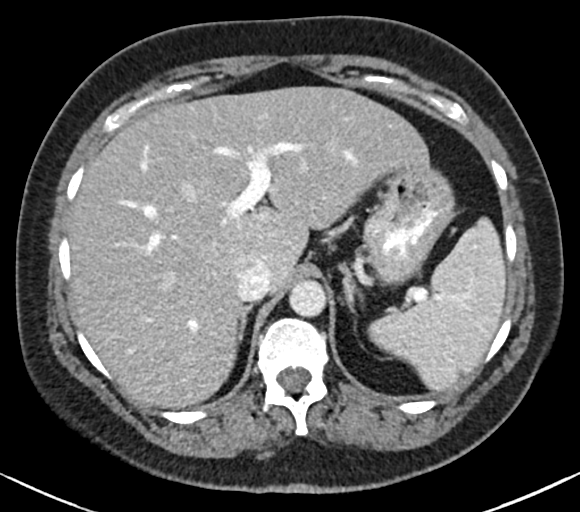
[im 84/89  soft-tissue]
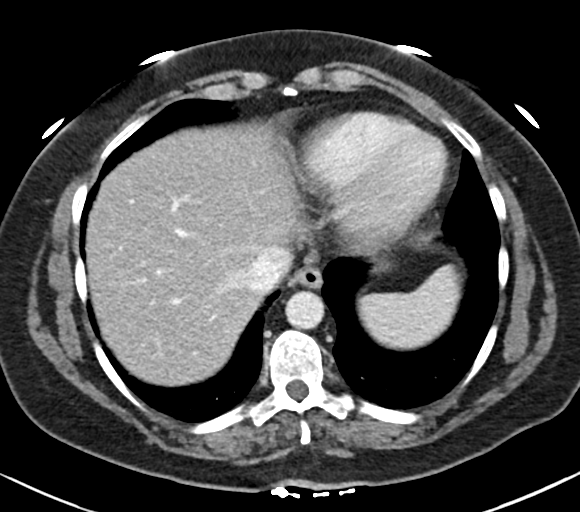

[Series 6: abd pelvis 2.00 br40 s3 cor · coronal · 0.66mm/px · 3 of 148 slices shown]
[im 50/148  soft-tissue]
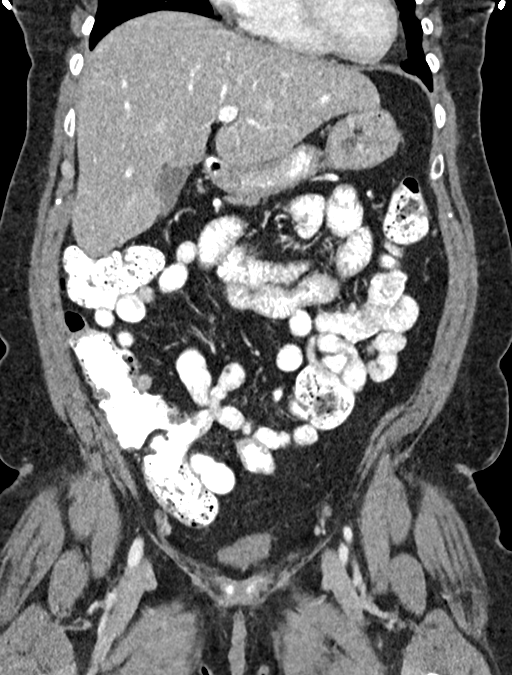
[im 66/148  soft-tissue]
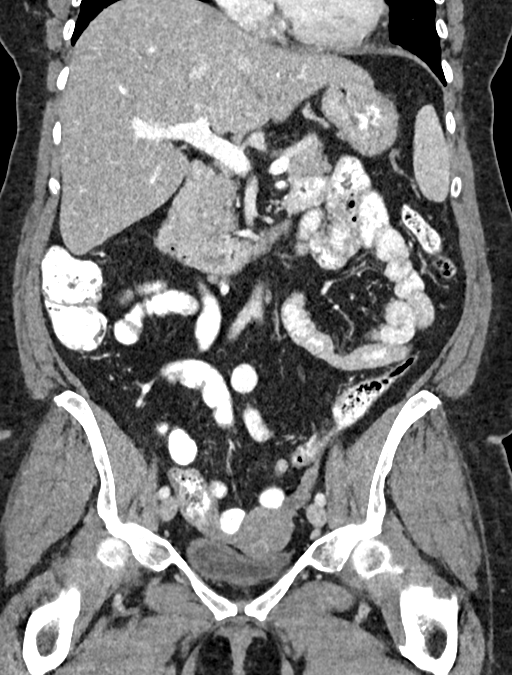
[im 82/148  soft-tissue]
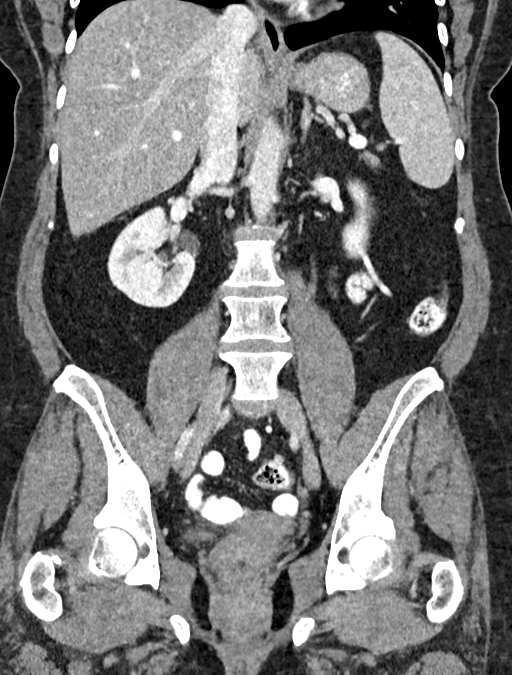

[14 of 46 positions shown; findings below may reference images not displayed]

FINDINGS: Lower chest: Mild subsegmental atelectatic changes seen dependently
within the visualized lung bases. Visualized lungs are otherwise
clear.

Hepatobiliary: Liver demonstrates a normal contrast enhanced
appearance. Gallbladder within normal limits. No biliary dilatation.

Pancreas: Pancreas within normal limits.

Spleen: Spleen within normal limits.

Adrenals/Urinary Tract: Adrenal glands are normal. Kidneys equal
size with symmetric enhancement. No nephrolithiasis, hydronephrosis,
or focal enhancing renal mass. No hydroureter. Partially distended
bladder within normal limits.

Stomach/Bowel: Stomach within normal limits. No evidence for bowel
obstruction. Enteric contrast material reaches the level the rectum.
No abnormal wall thickening, mucosal enhancement, or inflammatory
fat stranding seen about the bowels. Appendix not visualize,
consistent with history of prior appendectomy.

Vascular/Lymphatic: Intra-abdominal aorta of normal caliber without
aneurysm. Normal intravascular enhancement seen throughout the
intra-abdominal aorta and its branch vessels. No pathologically
enlarged intra-abdominal or pelvic lymph nodes.

Reproductive: Uterus and ovaries within normal limits for age.

Other: No free air or fluid. Diastasis of the rectus abdominis
musculature with tiny fat containing paraumbilical hernia.

Musculoskeletal: No acute osseous abnormality. No worrisome lytic or
blastic osseous lesions.
IMPRESSION: 1. No CT evidence for acute intra-abdominal or pelvic process. No
findings to explain patient's symptoms identified.
2. Prior appendectomy.
3. Tiny fat containing paraumbilical hernia without associated
inflammation.

## 2019-04-14 DIAGNOSIS — M25561 Pain in right knee: Secondary | ICD-10-CM | POA: Insufficient documentation

## 2019-05-09 ENCOUNTER — Other Ambulatory Visit: Payer: Self-pay | Admitting: Specialist

## 2019-05-09 DIAGNOSIS — M25561 Pain in right knee: Secondary | ICD-10-CM

## 2019-05-14 ENCOUNTER — Ambulatory Visit
Admission: RE | Admit: 2019-05-14 | Discharge: 2019-05-14 | Disposition: A | Discharge status: Home / Self Care | Payer: Managed Care, Other (non HMO) | Source: Ambulatory Visit | Attending: Specialist | Admitting: Specialist

## 2019-05-14 ENCOUNTER — Other Ambulatory Visit: Payer: Self-pay

## 2019-05-14 DIAGNOSIS — M25561 Pain in right knee: Secondary | ICD-10-CM

## 2019-07-08 ENCOUNTER — Encounter: Payer: Self-pay | Admitting: Certified Nurse Midwife

## 2019-08-19 NOTE — Progress Notes (Signed)
[facility]    Provider:  Dr Jaynee Eagles Requesting Provider: Sydnee Cabal, MD Primary Care Provider:  Maury Dus, MD  CC:  Back pain and numbness and tingling in the legs  HPI:  Mallory Carter is a 64 y.o. female here as requested by  Sydnee Cabal, MD for neuropathy. PMHx thyroid disease, sleep apnes, joint pain, HTN, diabetes, depression, arthritis, anxiety, anemia.  I reviewed notes from Dr. Sydnee Cabal: She was seen for evaluation of medial meniscus tear with a history of fracture.  It appears she had post right knee arthroscopy, partial medial meniscectomy and chondroplasty which was started after she stepped in a hole in [place] and reported mechanical symptoms such as locking and catching.  Prior to her right knee her surgery she had left knee PMM/PLM/chondroplasty, partial medial and partial lateral meniscectomy and chondroplasty which she describes no pain at 6 weeks follow-up of the surgery.  Patient was seen for follow-up of 6 weeks right knee removal loose body chondroplasty, reported pain level 4 out of 10, had recent PT home exercise program, at the last appointment was not taking any medication, not doing as well if she thought she would, still feels very swollen if she walks a lot or stands a lot, she feels pinpricks from the knee to the ankle at nighttime, unfortunately the right knee was not doing as well as the left knee per Dr. Theda Sers, he stated she has more pain and swelling than he would like on the right side however the left knee seems to be doing well, she also reports tingling in both lower extremities from the knees down at night, pins-and-needles, no back pain.  He did aspirate a large effusion of the right knee 20 cc of serous fluid, but he also did believe she was having neuropathic pain in the bilateral lower extremities and referred her here for consult.   She had bilateral knee surgeries. The tingling predates the surgeries. It starts on the  lateral sides of the lower legs.and she gets some cramping in the feet. She does not think the surgeries have anything to do with it. About a year ago. It happens at night when she has her feet up or when she is sitting. Happens bilaterally and symmetrically in both legs but sometimes happen in one more than the other but usually both legs. She has low back pain and shooting pain into her buttocks more on the right side goes down the back of the right thigh. She can't stand in one position. She bends her legs at night, doesn't sleep on her back. No leg weakness. No color changes in the feet. Some minimal swelling in the feet. Not painful but uncomfortable. Crossing legs make it worse. She sleeps with legs bent. She has chronic back pain, often it can be severe and she has difficulty getting out of bed and walking long distances she has to sit down. Also rediation into the legs. She has been under the care of a physician for years, tried analgesics, muscle relaxers, pain meds, exercise.  Reviewed notes, labs and imaging from outside physicians, which showed:  I reviewed MRI of the right knee report May 14, 2019 which showed extensive partial and full-thickness cartilage loss on the patella, moderate joint effusion with a 5 mm loose body in the joint.  The loose body in the joint is lying posterior to the root of the posterior horn of the medial meniscus, no Baker's cyst, intact popliteus tendon, articular cartilage of  the trochlear groove appears normal, cartilage loss on the patella.  I also reviewed the mri report of the left knee, unknown date, which showed a high-grade radial tear PR millimeters without extrusion, mild to moderate medial tibiofemoral and mild lateral tibiofemoral chondromalacia, high-grade patellar chondral loss, small effusion and popliteal cyst.  CMP 09/2017 ALT 40, ca 10.5 otherwise normal, BUN/CR 14/0.8  Review of Systems: Patient complains of symptoms per HPI as well as the  following symptoms numbness and tinging, low back pain . Pertinent negatives and positives per HPI. All others negative.   Social History   Socioeconomic History  . Marital status: Married    Spouse name: Not on file  . Number of children: 2  . Years of education: Not on file  . Highest education level: Some college, no degree  Occupational History  . Occupation: Web designer with [employer]    Employer: [employer]  Tobacco Use  . Smoking status: Never Smoker  . Smokeless tobacco: Never Used  Substance and Sexual Activity  . Alcohol use: Yes    Comment: "some"  . Drug use: No  . Sexual activity: Yes    Partners: Male    Birth control/protection: Surgical, Post-menopausal    Comment: btl  Other Topics Concern  . Not on file  Social History Narrative   Lives at home with husband   Right handed   Caffeine: 1-2 cups coffee per day    Social Determinants of Health   Financial Resource Strain:   . Difficulty of Paying Living Expenses:   Food Insecurity:   . Worried About Charity fundraiser in the Last Year:   . Arboriculturist in the Last Year:   Transportation Needs:   . Film/video editor (Medical):   Marland Kitchen Lack of Transportation (Non-Medical):   Physical Activity:   . Days of Exercise per Week:   . Minutes of Exercise per Session:   Stress:   . Feeling of Stress :   Social Connections:   . Frequency of Communication with Friends and Family:   . Frequency of Social Gatherings with Friends and Family:   . Attends Religious Services:   . Active Member of Clubs or Organizations:   . Attends Archivist Meetings:   Marland Kitchen Marital Status:   Intimate Partner Violence:   . Fear of Current or Ex-Partner:   . Emotionally Abused:   Marland Kitchen Physically Abused:   . Sexually Abused:     Family History  Problem Relation Age of Onset  . Diabetes Mother   . Hypertension Mother   . Heart Problems Mother   . Lung disease Mother   . Cancer Father        skin  .  Hypertension Father   . Diabetes Sister   . Diabetes Brother   . Hypertension Brother   . Heart attack Brother   . Heart disease Brother   . Diabetes Maternal Grandmother   . Diabetes Maternal Grandfather   . Stroke Maternal Grandfather   . Diabetes Brother   . Breast cancer Neg Hx     Past Medical History:  Diagnosis Date  . Abnormal Pap smear of cervix    late 1980's  . Anemia   . Anxiety   . Arthritis   . Cancer ([place])    skin  . Depression   . Diabetes ([place])   . Dysmenorrhea   . Genital warts   . GERD (gastroesophageal reflux disease)   . High  cholesterol   . Hx of fracture   . Hypertension   . Joint pain   . Low vitamin D level   . Sleep apnea    CPAP  . Thyroid disease    no issues lately  . Urinary incontinence    occ    Patient Active Problem List   Diagnosis Date Noted  . Intramural and subserous leiomyoma of uterus 10/10/2017  . Pain in right hand 05/07/2017  . Tenosynovitis of right hand 05/07/2017  . History of abnormal Pap smear 07/17/2012  . HYPERLIPIDEMIA 11/04/2009  . Essential hypertension 11/04/2009  . Hypothyroidism 11/03/2009  . OBSTRUCTIVE SLEEP APNEA 11/03/2009    Past Surgical History:  Procedure Laterality Date  . APPENDECTOMY  1990  . 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 bone spur removal    . BUNIONECTOMY    . CARPAL TUNNEL RELEASE Right 2000s  . [place]  . COLON RESECTION     took 18 inches out  . FOOT SURGERY    . LASIK    . ROTATOR CUFF REPAIR Right 2014  . [place]   calcium deposits  . TONSILLECTOMY  1963  . TUBAL LIGATION      Current Outpatient Medications  Medication Sig Dispense Refill  . Cholecalciferol (VITAMIN D) 2000 UNITS tablet Take 2,000 Units by mouth daily.    Marland Kitchen lisinopril (PRINIVIL,ZESTRIL) 20 MG tablet Take 20 mg by mouth daily.    . metFORMIN (GLUCOPHAGE) 500 MG tablet Take 500 mg by mouth daily with breakfast.    . pravastatin (PRAVACHOL) 40  MG tablet Take 40 mg by mouth daily.    . sertraline (ZOLOFT) 100 MG tablet Take 100 mg by mouth daily.    Marland Kitchen gabapentin (NEURONTIN) 300 MG capsule Take 1 capsule (300 mg total) by mouth at bedtime. 30 capsule 6   No current facility-administered medications for this visit.    Allergies as of 08/20/2019  . (No Known Allergies)    Vitals: BP 134/84 (BP Location: Left Arm, Patient Position: Sitting)   Pulse 75   Temp 98 F (36.7 C) Comment: taken at front  Ht 5\' 3"  (1.6 m)   Wt 187 lb (84.8 kg)   LMP 11/01/2011   BMI 33.13 kg/m  Last Weight:  Wt Readings from Last 1 Encounters:  08/20/19 187 lb (84.8 kg)   Last Height:   Ht Readings from Last 1 Encounters:  08/20/19 5\' 3"  (1.6 m)     Physical exam: Exam: Gen: NAD, conversant, well nourised, obese, well groomed                     CV: RRR, no MRG. No Carotid Bruits. No peripheral edema, warm, nontender Eyes: Conjunctivae clear without exudates or hemorrhage  Neuro: Detailed Neurologic Exam  Speech:    Speech is normal; fluent and spontaneous with normal comprehension.  Cognition:    The patient is oriented to person, place, and time;     recent and remote memory intact;     language fluent;     normal attention, concentration,     fund of knowledge Cranial Nerves:    The pupils are equal, round, and reactive to light. The fundi are normal and spontaneous venous pulsations are present. Visual fields are full to finger confrontation. Extraocular movements are intact. Trigeminal sensation is intact and the muscles of mastication are normal. The face is symmetric.  The palate elevates in the midline. Hearing intact. Voice is normal. Shoulder shrug is normal. The tongue has normal motion without fasciculations.   Coordination:    Normal finger to nose and heel to shin. Normal rapid alternating movements.   Gait:    Heel-toe and tandem gait are normal.   Motor Observation:    No asymmetry, no atrophy, and no involuntary  movements noted. Tone:    Normal muscle tone.    Posture:    Posture is normal. normal erect    Strength: 4+/5 bilat leg flexion.    Strength is V/V in the upper and lower limbs.      Sensation: decreased lateral lower legs to pin prick     Reflex Exam:  DTR's:    Deep tendon reflexes in the upper and lower extremities are normal bilaterally.   Toes:    The toes are downgoing bilaterally.   Clonus:    Clonus is absent.    Assessment/Plan:  14 64 year old with tingling in the peroneal nerve distribution and tenderness at the fibular head. 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  Peroneal neuropathy: discussed causes, conservative prebention. Order emg/ncs.  She has chronic back pain, often it can be severe and she has difficulty getting out of bed and walking long distances she has to sit down. Also rediation into the legs. She has been under the care of a physician for years, tried analgesics, muscle relaxers, pain meds, exercise. Exam shows leg weakness and sensory changes. Needs MRI lumbar spine to evaluate for concomitant lumbosacral radiculopathy for surgical or other interventions.Also emg/ncs.  Gabapentin for pain at night    Orders Placed This Encounter  Procedures  . MR LUMBAR SPINE WO CONTRAST  . NCV with EMG(electromyography)   Meds ordered this encounter  Medications  . gabapentin (NEURONTIN) 300 MG capsule    Sig: Take 1 capsule (300 mg total) by mouth at bedtime.    Dispense:  30 capsule    Refill:  6    Cc: Maury Dus, MD,   Sydnee Cabal, MD  Sarina Ill, MD  [facility] [address] [place],  [address]  Phone [phone] Fax [phone]

## 2019-08-20 ENCOUNTER — Other Ambulatory Visit: Payer: Self-pay

## 2019-08-20 ENCOUNTER — Encounter: Payer: Self-pay | Admitting: Neurology

## 2019-08-20 ENCOUNTER — Ambulatory Visit (INDEPENDENT_AMBULATORY_CARE_PROVIDER_SITE_OTHER): Payer: Managed Care, Other (non HMO) | Admitting: Neurology

## 2019-08-20 VITALS — BP 134/84 | HR 75 | Temp 98.0°F | Ht 63.0 in | Wt 187.0 lb

## 2019-08-20 DIAGNOSIS — R29898 Other symptoms and signs involving the musculoskeletal system: Secondary | ICD-10-CM

## 2019-08-20 DIAGNOSIS — G8929 Other chronic pain: Secondary | ICD-10-CM

## 2019-08-20 DIAGNOSIS — G9519 Other vascular myelopathies: Secondary | ICD-10-CM

## 2019-08-20 DIAGNOSIS — R200 Anesthesia of skin: Secondary | ICD-10-CM

## 2019-08-20 DIAGNOSIS — M4807 Spinal stenosis, lumbosacral region: Secondary | ICD-10-CM | POA: Diagnosis not present

## 2019-08-20 DIAGNOSIS — R202 Paresthesia of skin: Secondary | ICD-10-CM | POA: Diagnosis not present

## 2019-08-20 DIAGNOSIS — M5417 Radiculopathy, lumbosacral region: Secondary | ICD-10-CM

## 2019-08-20 DIAGNOSIS — M5442 Lumbago with sciatica, left side: Secondary | ICD-10-CM

## 2019-08-20 DIAGNOSIS — M5441 Lumbago with sciatica, right side: Secondary | ICD-10-CM

## 2019-08-20 MED ORDER — GABAPENTIN 300 MG PO CAPS
300.0000 mg | ORAL_CAPSULE | Freq: Every day | ORAL | 6 refills | Status: DC
Start: 2019-08-20 — End: 2019-09-25

## 2019-08-20 NOTE — Patient Instructions (Signed)
Common Peroneal Nerve Entrapment  Common peroneal nerve entrapment is a condition that can make it hard to lift a foot. The condition results from pressure on a nerve in the lower leg called the common peroneal nerve. Your common peroneal nerve provides feeling to your outer lower leg and foot. It also supplies the muscles that move your foot and toes upward and outward. What are the causes? This condition may be caused by:  Sitting cross-legged, squatting, or kneeling for long periods of time.  A hard, direct hit to the side of the lower leg.  Swelling from a knee injury.  A break (fracture) in one of the lower leg bones.  Wearing a boot or cast that ends just below the knee.  A growth or cyst near the nerve. What increases the risk? This condition is more likely to develop in people who play:  Contact sports, such as football or hockey.  Sports where you wear high and stiff boots, such as skiing. What are the signs or symptoms? Symptoms of this condition include:  Trouble lifting your foot up (foot drop).  Tripping often.  Your foot hitting the ground harder than normal as you walk.  Numbness, tingling, or pain in the outside of the knee, outside of the lower leg, and top of the foot.  Sensitivity to pressure on the front or side of the leg. How is this diagnosed? This condition may be diagnosed based on:  Your symptoms.  Your medical history.  A physical exam.  Tests, such as: ? An X-ray to check the bones of your knee and leg. ? MRI to check tendons that attach to the side of your knee. ? An ultrasound to check for a growth or cyst. ? An electromyogram (EMG) to check your nerves. During your physical exam, your health care provider will check for numbness in your leg and test the strength of your lower leg muscles. He or she may tap the side of your lower leg to see if that causes tingling. How is this treated? Treatment for this condition may  include:  Avoiding activities that make symptoms worse.  Using a brace to hold up your foot and toes.  Taking anti-inflammatory pain medicines to relieve swelling and lessen pain.  Having medicines injected into your ankle joint to lessen pain and swelling.  Doing exercises to help you regain or maintain movement (physical therapy).  Surgery to take pressure off the nerve. This may be needed if there is no improvement after 2-3 months or if there is a growth pushing on the nerve.  Returning gradually to full activity. Follow these instructions at home: If you have a brace:  Wear it as told by your health care provider. Remove it only as told by your health care provider.  Loosen the brace if your toes tingle, become numb, or turn cold and blue.  Keep the brace clean.  If the brace is not waterproof: ? Do not let it get wet. ? Cover it with a watertight covering when you take a bath or a shower.  Ask your health care provider when it is safe to drive with a brace on your foot. Activity  Return to your normal activities as told by your health care provider. Ask your health care provider what activities are safe for you.  Do not do any activities that make pain or swelling worse.  Do exercises as told by your health care provider. General instructions  Take over-the-counter and prescription medicines  only as told by your health care provider.  Do not put your full weight on your knee until your health care provider says you can. Use crutches as directed by your health care provider.  Keep all follow-up visits as told by your health care provider. This is important. How is this prevented?  Wear supportive footwear that is appropriate for your athletic activity.  Avoid athletic activities that cause ankle pain or swelling.  Wear protective padding over your lower legs when playing contact sports.  Make sure your boots do not put extra pressure on the area just below your  knees.  Do not sit cross-legged for long periods of time. Contact a health care provider if:  Your symptoms do not get better in 2-3 months.  The weakness or numbness in your leg or foot gets worse. Summary  Common peroneal nerve entrapment is a condition that results from pressure on a nerve in the lower leg called the common peroneal nerve.  This condition may be caused by a hard hit, swelling, a fracture, or a cyst in the lower leg.  Treatment may include rest, a brace, medicines, and physical therapy. Sometimes surgery is needed.  Do not do any activities that make pain or swelling worse. This information is not intended to replace advice given to you by your health care provider. Make sure you discuss any questions you have with your health care provider. Document Revised: 02/11/2018 Document Reviewed: 02/11/2018 Elsevier Patient Education  [place]. Sciatica  Sciatica is pain, numbness, weakness, or tingling along the path of the sciatic nerve. The sciatic nerve starts in the lower back and runs down the back of each leg. The nerve controls the muscles in the lower leg and in the back of the knee. It also provides feeling (sensation) to the back of the thigh, the lower leg, and the sole of the foot. Sciatica is a symptom of another medical condition that pinches or puts pressure on the sciatic nerve. Sciatica most often only affects one side of the body. Sciatica usually goes away on its own or with treatment. In some cases, sciatica may come back (recur). What are the causes? This condition is caused by pressure on the sciatic nerve or pinching of the nerve. This may be the result of:  A disk in between the bones of the spine bulging out too far (herniated disk).  Age-related changes in the spinal disks.  A pain disorder that affects a muscle in the buttock.  Extra bone growth near the sciatic nerve.  A break (fracture) of the pelvis.  Pregnancy.  Tumor. This  is rare. What increases the risk? The following factors may make you more likely to develop this condition:  Playing sports that place pressure or stress on the spine.  Having poor strength and flexibility.  A history of back injury or surgery.  Sitting for long periods of time.  Doing activities that involve repetitive bending or lifting.  Obesity. What are the signs or symptoms? Symptoms can vary from mild to very severe, and they may include:  Any of these problems in the lower back, leg, hip, or buttock: ? Mild tingling, numbness, or dull aches. ? Burning sensations. ? Sharp pains.  Numbness in the back of the calf or the sole of the foot.  Leg weakness.  Severe back pain that makes movement difficult. Symptoms may get worse when you cough, sneeze, or laugh, or when you sit or stand for long periods of  time. How is this diagnosed? This condition may be diagnosed based on:  Your symptoms and medical history.  A physical exam.  Blood tests.  Imaging tests, such as: ? X-rays. ? MRI. ? CT scan. How is this treated? In many cases, this condition improves on its own without treatment. However, treatment may include:  Reducing or modifying physical activity.  Exercising and stretching.  Icing and applying heat to the affected area.  Medicines that help to: ? Relieve pain and swelling. ? Relax your muscles.  Injections of medicines that help to relieve pain, irritation, and inflammation around the sciatic nerve (steroids).  Surgery. Follow these instructions at home: Medicines  Take over-the-counter and prescription medicines only as told by your health care provider.  Ask your health care provider if the medicine prescribed to you: ? Requires you to avoid driving or using heavy machinery. ? Can cause constipation. You may need to take these actions to prevent or treat constipation:  Drink enough fluid to keep your urine pale yellow.  Take  over-the-counter or prescription medicines.  Eat foods that are high in fiber, such as beans, whole grains, and fresh fruits and vegetables.  Limit foods that are high in fat and processed sugars, such as fried or sweet foods. Managing pain      If directed, put ice on the affected area. ? Put ice in a plastic bag. ? Place a towel between your skin and the bag. ? Leave the ice on for 20 minutes, 2-3 times a day.  If directed, apply heat to the affected area. Use the heat source that your health care provider recommends, such as a moist heat pack or a heating pad. ? Place a towel between your skin and the heat source. ? Leave the heat on for 20-30 minutes. ? Remove the heat if your skin turns bright red. This is especially important if you are unable to feel pain, heat, or cold. You may have a greater risk of getting burned. Activity   Return to your normal activities as told by your health care provider. Ask your health care provider what activities are safe for you.  Avoid activities that make your symptoms worse.  Take brief periods of rest throughout the day. ? When you rest for longer periods, mix in some mild activity or stretching between periods of rest. This will help to prevent stiffness and pain. ? Avoid sitting for long periods of time without moving. Get up and move around at least one time each hour.  Exercise and stretch regularly, as told by your health care provider.  Do not lift anything that is heavier than 10 lb (4.5 kg) while you have symptoms of sciatica. When you do not have symptoms, you should still avoid heavy lifting, especially repetitive heavy lifting.  When you lift objects, always use proper lifting technique, which includes: ? Bending your knees. ? Keeping the load close to your body. ? Avoiding twisting. General instructions  Maintain a healthy weight. Excess weight puts extra stress on your back.  Wear supportive, comfortable shoes. Avoid  wearing high heels.  Avoid sleeping on a mattress that is too soft or too hard. A mattress that is firm enough to support your back when you sleep may help to reduce your pain.  Keep all follow-up visits as told by your health care provider. This is important. Contact a health care provider if:  You have pain that: ? Wakes you up when you are sleeping. ? Gets  worse when you lie down. ? Is worse than you have experienced in the past. ? Lasts longer than 4 weeks.  You have an unexplained weight loss. Get help right away if:  You are not able to control when you urinate or have bowel movements (incontinence).  You have: ? Weakness in your lower back, pelvis, buttocks, or legs that gets worse. ? Redness or swelling of your back. ? A burning sensation when you urinate. Summary  Sciatica is pain, numbness, weakness, or tingling along the path of the sciatic nerve.  This condition is caused by pressure on the sciatic nerve or pinching of the nerve.  Sciatica can cause pain, numbness, or tingling in the lower back, legs, hips, and buttocks.  Treatment often includes rest, exercise, medicines, and applying ice or heat. This information is not intended to replace advice given to you by your health care provider. Make sure you discuss any questions you have with your health care provider. Document Revised: 04/22/2018 Document Reviewed: 04/22/2018 Elsevier Patient Education  [place].

## 2019-08-21 ENCOUNTER — Telehealth: Payer: Self-pay | Admitting: Neurology

## 2019-08-21 ENCOUNTER — Encounter: Payer: Self-pay | Admitting: Neurology

## 2019-08-21 NOTE — Telephone Encounter (Signed)
[employer] order sent to [facility]. They will obtain the auth and reach out to the patient to schedule.  

## 2019-09-04 NOTE — Telephone Encounter (Signed)
[employer] pending. Notes have been uploaded and faxed.

## 2019-09-07 ENCOUNTER — Ambulatory Visit
Admission: RE | Admit: 2019-09-07 | Discharge: 2019-09-07 | Disposition: A | Discharge status: Home / Self Care | Payer: Managed Care, Other (non HMO) | Source: Ambulatory Visit | Attending: Neurology | Admitting: Neurology

## 2019-09-07 ENCOUNTER — Other Ambulatory Visit: Payer: Self-pay

## 2019-09-07 DIAGNOSIS — R29898 Other symptoms and signs involving the musculoskeletal system: Secondary | ICD-10-CM

## 2019-09-07 DIAGNOSIS — R200 Anesthesia of skin: Secondary | ICD-10-CM | POA: Diagnosis not present

## 2019-09-07 DIAGNOSIS — M4807 Spinal stenosis, lumbosacral region: Secondary | ICD-10-CM

## 2019-09-07 DIAGNOSIS — R202 Paresthesia of skin: Secondary | ICD-10-CM | POA: Diagnosis not present

## 2019-09-07 DIAGNOSIS — G9519 Other vascular myelopathies: Secondary | ICD-10-CM

## 2019-09-07 DIAGNOSIS — G8929 Other chronic pain: Secondary | ICD-10-CM

## 2019-09-07 DIAGNOSIS — M5417 Radiculopathy, lumbosacral region: Secondary | ICD-10-CM

## 2019-09-08 NOTE — Telephone Encounter (Signed)
Mallory Carter:[id number] (exp. 12/01/19) patient had MRI at GI on 09/07/19.

## 2019-09-25 ENCOUNTER — Ambulatory Visit (INDEPENDENT_AMBULATORY_CARE_PROVIDER_SITE_OTHER): Payer: Managed Care, Other (non HMO) | Admitting: Neurology

## 2019-09-25 ENCOUNTER — Other Ambulatory Visit: Payer: Self-pay

## 2019-09-25 DIAGNOSIS — R202 Paresthesia of skin: Secondary | ICD-10-CM | POA: Diagnosis not present

## 2019-09-25 DIAGNOSIS — Z0289 Encounter for other administrative examinations: Secondary | ICD-10-CM

## 2019-09-25 DIAGNOSIS — R200 Anesthesia of skin: Secondary | ICD-10-CM

## 2019-09-25 MED ORDER — GABAPENTIN 300 MG PO CAPS: 600.0000 mg | ORAL_CAPSULE | Freq: Every day | ORAL | 3 refills | Status: DC

## 2019-09-25 NOTE — Progress Notes (Signed)
Full Name: Mallory Carter Gender: Female MRN #: [id number] Date of Birth: [birth year]    Visit Date: 09/25/2019 08:41 Age: 64 Years Examining Physician:   Mallory Ill, MD  Requesting Provider:   Sydnee Cabal, MD Primary Care Provider: Maury Dus, MD Height: 5 feet 3 inch    History:  [name] 64 year old with tingling in the peroneal nerve distribution and tenderness at the fibular head, bilaterally. MRI of the lumbar spine showed minimal to mild disc degenerative changes and no nerve root compression or spinal stenosis at any of the levels  Summary: EMG/NCS was performed on the bilateral lower extremities.  The left superficial peroneal sensory nerve showed reduced amplitude (5 V, normal greater than 6).  The right superficial peroneal sensory nerve showed reduced amplitude (5 V, normal greater than 6). All remaining nerves (as indicated in the following tables) were within normal limits. All muscles (as indicated in the following tables) were within normal limits.    Conclusion: Mildly reduced amplitudes of the bilateral peroneal sensory nerves along with her clinical presentation consistent with mild peroneal neuropathy.  Cannot localize based on the results of this test however clinically patient does have tenderness bilaterally at the fibular heads.   Mallory Carter M.D.  [facility] [address] [place], [place] [address] Tel: [phone] Fax: [phone]  Verbal informed consent was obtained from the patient, patient was informed of potential risk of procedure, including bruising, bleeding, hematoma formation, infection, muscle weakness, muscle pain, numbness, among others.        [place]    Nerve / Sites Muscle Latency Ref. Amplitude Ref. Rel Amp Segments Distance Velocity Ref. Area    ms ms mV mV %  cm m/s m/s mVms  L Peroneal - EDB     Ankle EDB 4.2 ?6.5 8.5 ?2.0 100 Ankle - EDB 9   25.8     Fib head EDB 10.2  7.8  91.2 Fib head - Ankle  27 45 ?44 23.5     Pop fossa EDB 12.5  7.9  101 Pop fossa - Fib head 10 45 ?44 24.8         Pop fossa - Ankle      R Peroneal - EDB     Ankle EDB 4.0 ?6.5 8.9 ?2.0 100 Ankle - EDB 9   28.0     Fib head EDB 10.0  8.2  91.6 Fib head - Ankle 27 45 ?44 27.4     Pop fossa EDB 12.3  7.7  94.9 Pop fossa - Fib head 10 44 ?44 26.3         Pop fossa - Ankle      L Tibial - AH     Ankle AH 4.6 ?5.8 5.9 ?4.0 100 Ankle - AH 9   13.6     Pop fossa AH 13.2  4.3  72.6 Pop fossa - Ankle 35 41 ?41 15.2  R Tibial - AH     Ankle AH 5.3 ?5.8 5.1 ?4.0 100 Ankle - AH 9   10.0     Pop fossa AH 14.1  4.2  81.3 Pop fossa - Ankle 36 41 ?41 13.9  L Peroneal - Tib Ant     Fib Head Tib Ant 3.0 ?4.7 6.9 ?3.0 100 Fib Head - Tib Ant 10   63.5     Pop fossa Tib Ant 4.3  0.6  8.55 Pop fossa - Fib Head 10 75 ?44  3.6               [place]    Nerve / Sites Rec. Site Peak Lat Ref.  Amp Ref. Segments Distance    ms ms V V  cm  L Sural - Ankle (Calf)     Calf Ankle 4.4 ?4.4 13 ?6 Calf - Ankle 14  R Sural - Ankle (Calf)     Calf Ankle 3.7 ?4.4 17 ?6 Calf - Ankle 14  L Superficial peroneal - Ankle     Lat leg Ankle 4.4 ?4.4 5 ?6 Lat leg - Ankle 14  R Superficial peroneal - Ankle     Lat leg Ankle 4.4 ?4.4 5 ?6 Lat leg - Ankle 14             F  Wave    Nerve F Lat Ref.   ms ms  L Tibial - AH 46.5 ?56.0  R Tibial - AH 46.7 ?56.0         EMG Summary Table    Spontaneous MUAP Recruitment  Muscle IA Fib PSW Fasc Other Amp Dur. Poly Pattern  R. Extensor hallucis longus Normal None None None _______ Normal Normal Normal Normal  R. Vastus medialis Normal None None None _______ Normal Normal Normal Normal  R. Tibialis anterior Normal None None None _______ Normal Normal Normal Normal  R. Gastrocnemius (Medial head) Normal None None None _______ Normal Normal Normal Normal  R. Abductor hallucis Normal None None None _______ Normal Normal Normal Normal  R. Biceps femoris (long head) Normal None None None _______ Normal Normal  Normal Normal  R. Gluteus maximus Normal None None None _______ Normal Normal Normal Normal  R. Gluteus medius Normal None None None _______ Normal Normal Normal Normal  R. Lumbar paraspinals (low) Normal None None None _______ Normal Normal Normal Normal  R. Peroneus longus Normal None None None _______ Normal Normal Normal Normal

## 2019-09-25 NOTE — Patient Instructions (Signed)
Common Peroneal Nerve Entrapment  Common peroneal nerve entrapment is a condition that can make it hard to lift a foot. The condition results from pressure on a nerve in the lower leg called the common peroneal nerve. Your common peroneal nerve provides feeling to your outer lower leg and foot. It also supplies the muscles that move your foot and toes upward and outward. What are the causes? This condition may be caused by:  Sitting cross-legged, squatting, or kneeling for long periods of time.  A hard, direct hit to the side of the lower leg.  Swelling from a knee injury.  A break (fracture) in one of the lower leg bones.  Wearing a boot or cast that ends just below the knee.  A growth or cyst near the nerve. What increases the risk? This condition is more likely to develop in people who play:  Contact sports, such as football or hockey.  Sports where you wear high and stiff boots, such as skiing. What are the signs or symptoms? Symptoms of this condition include:  Trouble lifting your foot up (foot drop).  Tripping often.  Your foot hitting the ground harder than normal as you walk.  Numbness, tingling, or pain in the outside of the knee, outside of the lower leg, and top of the foot.  Sensitivity to pressure on the front or side of the leg. How is this diagnosed? This condition may be diagnosed based on:  Your symptoms.  Your medical history.  A physical exam.  Tests, such as: ? An X-ray to check the bones of your knee and leg. ? MRI to check tendons that attach to the side of your knee. ? An ultrasound to check for a growth or cyst. ? An electromyogram (EMG) to check your nerves. During your physical exam, your health care provider will check for numbness in your leg and test the strength of your lower leg muscles. He or she may tap the side of your lower leg to see if that causes tingling. How is this treated? Treatment for this condition may  include:  Avoiding activities that make symptoms worse.  Using a brace to hold up your foot and toes.  Taking anti-inflammatory pain medicines to relieve swelling and lessen pain.  Having medicines injected into your ankle joint to lessen pain and swelling.  Doing exercises to help you regain or maintain movement (physical therapy).  Surgery to take pressure off the nerve. This may be needed if there is no improvement after 2-3 months or if there is a growth pushing on the nerve.  Returning gradually to full activity. Follow these instructions at home: If you have a brace:  Wear it as told by your health care provider. Remove it only as told by your health care provider.  Loosen the brace if your toes tingle, become numb, or turn cold and blue.  Keep the brace clean.  If the brace is not waterproof: ? Do not let it get wet. ? Cover it with a watertight covering when you take a bath or a shower.  Ask your health care provider when it is safe to drive with a brace on your foot. Activity  Return to your normal activities as told by your health care provider. Ask your health care provider what activities are safe for you.  Do not do any activities that make pain or swelling worse.  Do exercises as told by your health care provider. General instructions  Take over-the-counter and prescription medicines   only as told by your health care provider.  Do not put your full weight on your knee until your health care provider says you can. Use crutches as directed by your health care provider.  Keep all follow-up visits as told by your health care provider. This is important. How is this prevented?  Wear supportive footwear that is appropriate for your athletic activity.  Avoid athletic activities that cause ankle pain or swelling.  Wear protective padding over your lower legs when playing contact sports.  Make sure your boots do not put extra pressure on the area just below your  knees.  Do not sit cross-legged for long periods of time. Contact a health care provider if:  Your symptoms do not get better in 2-3 months.  The weakness or numbness in your leg or foot gets worse. Summary  Common peroneal nerve entrapment is a condition that results from pressure on a nerve in the lower leg called the common peroneal nerve.  This condition may be caused by a hard hit, swelling, a fracture, or a cyst in the lower leg.  Treatment may include rest, a brace, medicines, and physical therapy. Sometimes surgery is needed.  Do not do any activities that make pain or swelling worse. This information is not intended to replace advice given to you by your health care provider. Make sure you discuss any questions you have with your health care provider. Document Revised: 02/11/2018 Document Reviewed: 02/11/2018 Elsevier Patient Education  2020 [employer]  

## 2019-09-29 NOTE — Progress Notes (Signed)
See procedure note.

## 2019-10-01 ENCOUNTER — Ambulatory Visit: Payer: Managed Care, Other (non HMO) | Admitting: Certified Nurse Midwife

## 2019-10-01 NOTE — Procedures (Signed)
Full Name: Mallory Carter Gender: Female MRN #: [id number] Date of Birth: 10/10/1955    Visit Date: 09/25/2019 08:41 Age: 64 Years Examining Physician:   Mallory Ill, MD  Requesting Provider:   Sydnee Cabal, MD Primary Care Provider: Maury Dus, MD Height: 5 feet 3 inch    History:  [name] 64 year old with tingling in the peroneal nerve distribution and tenderness at the fibular head, bilaterally. MRI of the lumbar spine showed minimal to mild disc degenerative changes and no nerve root compression or spinal stenosis at any of the levels  Summary: EMG/NCS was performed on the bilateral lower extremities.  The left superficial peroneal sensory nerve showed reduced amplitude (5 V, normal greater than 6).  The right superficial peroneal sensory nerve showed reduced amplitude (5 V, normal greater than 6). All remaining nerves (as indicated in the following tables) were within normal limits. All muscles (as indicated in the following tables) were within normal limits.    Conclusion: Mildly reduced amplitudes of the bilateral peroneal sensory nerves along with her clinical presentation consistent with mild peroneal neuropathy.  Cannot localize based on the results of this test however clinically patient does have tenderness bilaterally at the fibular heads.   Mallory Carter M.D.  [facility] [address] [place], [place] [address] Tel: [phone] Fax: [phone]  Verbal informed consent was obtained from the patient, patient was informed of potential risk of procedure, including bruising, bleeding, hematoma formation, infection, muscle weakness, muscle pain, numbness, among others.        Mallory Carter    Nerve / Sites Muscle Latency Ref. Amplitude Ref. Rel Amp Segments Distance Velocity Ref. Area    ms ms mV mV %  cm m/s m/s mVms  L Peroneal - EDB     Ankle EDB 4.2 ?6.5 8.5 ?2.0 100 Ankle - EDB 9   25.8     Fib head EDB 10.2  7.8  91.2 Fib head - Ankle  27 45 ?44 23.5     Pop fossa EDB 12.5  7.9  101 Pop fossa - Fib head 10 45 ?44 24.8         Pop fossa - Ankle      R Peroneal - EDB     Ankle EDB 4.0 ?6.5 8.9 ?2.0 100 Ankle - EDB 9   28.0     Fib head EDB 10.0  8.2  91.6 Fib head - Ankle 27 45 ?44 27.4     Pop fossa EDB 12.3  7.7  94.9 Pop fossa - Fib head 10 44 ?44 26.3         Pop fossa - Ankle      L Tibial - AH     Ankle AH 4.6 ?5.8 5.9 ?4.0 100 Ankle - AH 9   13.6     Pop fossa AH 13.2  4.3  72.6 Pop fossa - Ankle 35 41 ?41 15.2  R Tibial - AH     Ankle AH 5.3 ?5.8 5.1 ?4.0 100 Ankle - AH 9   10.0     Pop fossa AH 14.1  4.2  81.3 Pop fossa - Ankle 36 41 ?41 13.9  L Peroneal - Tib Ant     Fib Head Tib Ant 3.0 ?4.7 6.9 ?3.0 100 Fib Head - Tib Ant 10   63.5     Pop fossa Tib Ant 4.3  0.6  8.55 Pop fossa - Fib Head 10 75 ?44  3.6               Mallory Carter    Nerve / Sites Rec. Site Peak Lat Ref.  Amp Ref. Segments Distance    ms ms V V  cm  L Sural - Ankle (Calf)     Calf Ankle 4.4 ?4.4 13 ?6 Calf - Ankle 14  R Sural - Ankle (Calf)     Calf Ankle 3.7 ?4.4 17 ?6 Calf - Ankle 14  L Superficial peroneal - Ankle     Lat leg Ankle 4.4 ?4.4 5 ?6 Lat leg - Ankle 14  R Superficial peroneal - Ankle     Lat leg Ankle 4.4 ?4.4 5 ?6 Lat leg - Ankle 14             F  Wave    Nerve F Lat Ref.   ms ms  L Tibial - AH 46.5 ?56.0  R Tibial - AH 46.7 ?56.0         EMG Summary Table    Spontaneous MUAP Recruitment  Muscle IA Fib PSW Fasc Other Amp Dur. Poly Pattern  R. Extensor hallucis longus Normal None None None _______ Normal Normal Normal Normal  R. Vastus medialis Normal None None None _______ Normal Normal Normal Normal  R. Tibialis anterior Normal None None None _______ Normal Normal Normal Normal  R. Gastrocnemius (Medial head) Normal None None None _______ Normal Normal Normal Normal  R. Abductor hallucis Normal None None None _______ Normal Normal Normal Normal  R. Biceps femoris (long head) Normal None None None _______ Normal Normal  Normal Normal  R. Gluteus maximus Normal None None None _______ Normal Normal Normal Normal  R. Gluteus medius Normal None None None _______ Normal Normal Normal Normal  R. Lumbar paraspinals (low) Normal None None None _______ Normal Normal Normal Normal  R. Peroneus longus Normal None None None _______ Normal Normal Normal Normal

## 2019-11-13 ENCOUNTER — Other Ambulatory Visit: Payer: Self-pay | Admitting: *Deleted

## 2019-11-13 MED ORDER — GABAPENTIN 300 MG PO CAPS: 600.0000 mg | ORAL_CAPSULE | Freq: Every day | ORAL | 2 refills | Status: DC

## 2020-01-19 ENCOUNTER — Other Ambulatory Visit: Payer: Self-pay | Admitting: Family Medicine

## 2020-01-19 DIAGNOSIS — Z1231 Encounter for screening mammogram for malignant neoplasm of breast: Secondary | ICD-10-CM

## 2020-02-25 ENCOUNTER — Other Ambulatory Visit: Payer: Self-pay

## 2020-02-25 ENCOUNTER — Ambulatory Visit
Admission: RE | Admit: 2020-02-25 | Discharge: 2020-02-25 | Disposition: A | Discharge status: Home / Self Care | Payer: Managed Care, Other (non HMO) | Source: Ambulatory Visit | Attending: Family Medicine | Admitting: Family Medicine

## 2020-02-25 DIAGNOSIS — Z1231 Encounter for screening mammogram for malignant neoplasm of breast: Secondary | ICD-10-CM

## 2020-03-31 DIAGNOSIS — G5602 Carpal tunnel syndrome, left upper limb: Secondary | ICD-10-CM | POA: Insufficient documentation

## 2020-03-31 DIAGNOSIS — G5620 Lesion of ulnar nerve, unspecified upper limb: Secondary | ICD-10-CM | POA: Insufficient documentation

## 2020-04-20 ENCOUNTER — Other Ambulatory Visit: Payer: Self-pay | Admitting: *Deleted

## 2020-04-20 MED ORDER — GABAPENTIN 300 MG PO CAPS: 600.0000 mg | ORAL_CAPSULE | Freq: Every day | ORAL | 0 refills | Status: DC

## 2020-04-23 ENCOUNTER — Other Ambulatory Visit: Payer: Self-pay

## 2020-04-23 ENCOUNTER — Ambulatory Visit: Payer: Managed Care, Other (non HMO) | Admitting: Obstetrics and Gynecology

## 2020-05-05 MED ORDER — GABAPENTIN 300 MG PO CAPS: 600.0000 mg | ORAL_CAPSULE | Freq: Every day | ORAL | 0 refills | Status: DC

## 2020-05-05 NOTE — Addendum Note (Signed)
Addended by: Gildardo Griffes on: 05/05/2020 09:35 AM   Modules accepted: Orders

## 2020-07-27 ENCOUNTER — Other Ambulatory Visit: Payer: Self-pay | Admitting: Neurology

## 2020-08-16 NOTE — Progress Notes (Signed)
65 y.o. G83P2002 Married White or Caucasian Not Hispanic or Latino female here for annual exam.  No vaginal bleeding. No dyspareunia.     H/O DM, last HgbA1C was 6.3.  Patient's last menstrual period was 11/01/2011.          Sexually active: Yes.    The current method of family planning is post menopausal status.    Exercising: No.  The patient does not participate in regular exercise at present. Smoker:  no  Health Maintenance: Pap:  09-19-16 neg HPV HR neg History of abnormal Pap:  Yes, f/u was normal.  MMG:  02/27/20 density B Bi-rads  1 neg  BMD:  2018 neg per patient-pcp manages Colonoscopy: UTD with PCP TDaP:  UTD with PCP Gardasil: NA   reports that she has never smoked. She has never used smokeless tobacco. She reports current alcohol use. She reports that she does not use drugs. Occasional ETOH. She is retired, worked for [employer]. She has 2 daughters and2 grand children (60 year old boy and 69 year old girl). Her one daughter (and granddaughter) are moving to the [place], the other daughter is here. Husband has Parkinsons, doing well.   Past Medical History:  Diagnosis Date  . Abnormal Pap smear of cervix    late 1980's  . Anemia   . Anxiety   . Arthritis   . Cancer ([place])    skin  . Depression   . Diabetes ([place])   . Dysmenorrhea   . Genital warts   . GERD (gastroesophageal reflux disease)   . High cholesterol   . Hx of fracture   . Hypertension   . Joint pain   . Low vitamin D level   . Sleep apnea    CPAP  . Thyroid disease    no issues lately  . Urinary incontinence    occ    Past Surgical History:  Procedure Laterality Date  . APPENDECTOMY  1990  . 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 bone spur removal    . BUNIONECTOMY    . CARPAL TUNNEL RELEASE Right 2000s  . [place]  . COLON RESECTION     took 18 inches out  . FOOT SURGERY    . LASIK    . ROTATOR CUFF REPAIR Right 2014  . [place]    calcium deposits  . TONSILLECTOMY  1963  . TUBAL LIGATION    C/S x 2  Current Outpatient Medications  Medication Sig Dispense Refill  . Cholecalciferol (VITAMIN D) 2000 UNITS tablet Take 2,000 Units by mouth daily.    Marland Kitchen gabapentin (NEURONTIN) 300 MG capsule TAKE 2 CAPSULES BY MOUTH AT BEDTIME 180 capsule 0  . lisinopril (PRINIVIL,ZESTRIL) 20 MG tablet Take 20 mg by mouth daily.    . metFORMIN (GLUCOPHAGE) 500 MG tablet Take 500 mg by mouth daily with breakfast.    . pravastatin (PRAVACHOL) 40 MG tablet Take 40 mg by mouth daily.    . sertraline (ZOLOFT) 100 MG tablet Take 100 mg by mouth daily.     No current facility-administered medications for this visit.    Family History  Problem Relation Age of Onset  . Diabetes Mother   . Hypertension Mother   . Heart Problems Mother   . Lung disease Mother   . Cancer Father        skin  . Hypertension Father   . Diabetes  Sister   . Diabetes Brother   . Hypertension Brother   . Heart attack Brother   . Heart disease Brother   . Diabetes Maternal Grandmother   . Diabetes Maternal Grandfather   . Stroke Maternal Grandfather   . Diabetes Brother   . Breast cancer Neg Hx     Review of Systems  All other systems reviewed and are negative.   Exam:   BP 122/68   Pulse 86   Ht 5\' 3"  (1.6 m)   Wt 184 lb (83.5 kg)   LMP 11/01/2011   SpO2 95%   BMI 32.59 kg/m   Weight change: @WEIGHTCHANGE @ Height:   Height: 5\' 3"  (160 cm)  Ht Readings from Last 3 Encounters:  08/18/20 5\' 3"  (1.6 m)  08/20/19 5\' 3"  (1.6 m)  09/27/18 5' 3.25" (1.607 m)    General appearance: alert, cooperative and appears stated age Head: Normocephalic, without obvious abnormality, atraumatic Neck: no adenopathy, supple, symmetrical, trachea midline and thyroid normal to inspection and palpation Lungs: clear to auscultation bilaterally Cardiovascular: regular rate and rhythm Breasts: normal appearance, no masses or tenderness Abdomen: soft, non-tender; non  distended,  no masses,  no organomegaly Extremities: extremities normal, atraumatic, no cyanosis or edema Skin: Skin color, texture, turgor normal. No rashes or lesions Lymph nodes: Cervical, supraclavicular, and axillary nodes normal. No abnormal inguinal nodes palpated Neurologic: Grossly normal   Pelvic: External genitalia:  no lesions              Urethra:  normal appearing urethra with no masses, tenderness or lesions              Bartholins and Skenes: normal                 Vagina: atrophic appearing vagina with normal color and discharge, no lesions              Cervix: no lesions               Bimanual Exam:  Uterus:  no masses or tenderness              Adnexa: no mass, fullness, tenderness               Rectovaginal: Confirms               Anus:  normal sphincter tone, no lesions  Gae Dry chaperoned for the exam.   1. Well woman exam Discussed breast self exam Discussed calcium and vit D intake Labs with primary Mammogram, colonoscopy and DEXA are UTD

## 2020-08-18 ENCOUNTER — Ambulatory Visit (INDEPENDENT_AMBULATORY_CARE_PROVIDER_SITE_OTHER): Payer: 59 | Admitting: Obstetrics and Gynecology

## 2020-08-18 ENCOUNTER — Other Ambulatory Visit: Payer: Self-pay

## 2020-08-18 ENCOUNTER — Encounter: Payer: Self-pay | Admitting: Obstetrics and Gynecology

## 2020-08-18 VITALS — BP 122/68 | HR 86 | Ht 63.0 in | Wt 184.0 lb

## 2020-08-18 DIAGNOSIS — K219 Gastro-esophageal reflux disease without esophagitis: Secondary | ICD-10-CM | POA: Insufficient documentation

## 2020-08-18 DIAGNOSIS — Z6832 Body mass index (BMI) 32.0-32.9, adult: Secondary | ICD-10-CM | POA: Insufficient documentation

## 2020-08-18 DIAGNOSIS — M8580 Other specified disorders of bone density and structure, unspecified site: Secondary | ICD-10-CM | POA: Insufficient documentation

## 2020-08-18 DIAGNOSIS — Z8249 Family history of ischemic heart disease and other diseases of the circulatory system: Secondary | ICD-10-CM | POA: Insufficient documentation

## 2020-08-18 DIAGNOSIS — Z01419 Encounter for gynecological examination (general) (routine) without abnormal findings: Secondary | ICD-10-CM | POA: Diagnosis not present

## 2020-08-18 DIAGNOSIS — E119 Type 2 diabetes mellitus without complications: Secondary | ICD-10-CM | POA: Insufficient documentation

## 2020-08-18 DIAGNOSIS — M25519 Pain in unspecified shoulder: Secondary | ICD-10-CM | POA: Insufficient documentation

## 2020-08-18 DIAGNOSIS — E1169 Type 2 diabetes mellitus with other specified complication: Secondary | ICD-10-CM | POA: Insufficient documentation

## 2020-08-18 DIAGNOSIS — R7303 Prediabetes: Secondary | ICD-10-CM | POA: Insufficient documentation

## 2020-08-18 DIAGNOSIS — R7401 Elevation of levels of liver transaminase levels: Secondary | ICD-10-CM | POA: Insufficient documentation

## 2020-08-18 DIAGNOSIS — E559 Vitamin D deficiency, unspecified: Secondary | ICD-10-CM | POA: Insufficient documentation

## 2020-08-18 DIAGNOSIS — K5732 Diverticulitis of large intestine without perforation or abscess without bleeding: Secondary | ICD-10-CM | POA: Insufficient documentation

## 2020-08-18 DIAGNOSIS — F419 Anxiety disorder, unspecified: Secondary | ICD-10-CM | POA: Insufficient documentation

## 2020-08-18 NOTE — Patient Instructions (Signed)

## 2020-08-23 ENCOUNTER — Ambulatory Visit: Payer: Managed Care, Other (non HMO) | Admitting: Obstetrics and Gynecology

## 2021-01-28 ENCOUNTER — Other Ambulatory Visit: Payer: Self-pay | Admitting: Neurology

## 2021-02-06 ENCOUNTER — Other Ambulatory Visit: Payer: Self-pay

## 2021-02-25 ENCOUNTER — Other Ambulatory Visit: Payer: Self-pay | Admitting: Family Medicine

## 2021-02-25 DIAGNOSIS — Z1231 Encounter for screening mammogram for malignant neoplasm of breast: Secondary | ICD-10-CM

## 2021-03-18 ENCOUNTER — Encounter: Payer: Self-pay | Admitting: Obstetrics and Gynecology

## 2021-03-18 ENCOUNTER — Ambulatory Visit (INDEPENDENT_AMBULATORY_CARE_PROVIDER_SITE_OTHER): Payer: 59

## 2021-03-18 ENCOUNTER — Other Ambulatory Visit: Payer: Self-pay

## 2021-03-18 ENCOUNTER — Ambulatory Visit
Admission: EM | Admit: 2021-03-18 | Discharge: 2021-03-18 | Disposition: A | Discharge status: Home / Self Care | Payer: 59

## 2021-03-18 DIAGNOSIS — J069 Acute upper respiratory infection, unspecified: Secondary | ICD-10-CM | POA: Diagnosis not present

## 2021-03-18 DIAGNOSIS — R059 Cough, unspecified: Secondary | ICD-10-CM | POA: Diagnosis not present

## 2021-03-18 MED ORDER — BENZONATATE 100 MG PO CAPS: 100.0000 mg | ORAL_CAPSULE | Freq: Three times a day (TID) | ORAL | 0 refills | Status: DC | PRN

## 2021-03-18 MED ORDER — ALBUTEROL SULFATE HFA 108 (90 BASE) MCG/ACT IN AERS
1.0000 | INHALATION_SPRAY | Freq: Four times a day (QID) | RESPIRATORY_TRACT | 0 refills | Status: DC | PRN
Start: 2021-03-18 — End: 2021-08-23

## 2021-03-18 NOTE — ED Provider Notes (Signed)
[name]    CSN: [id number] Arrival date & time: 03/18/21  1036      History   Chief Complaint Chief Complaint  Patient presents with   Cough    HPI Mallory Carter is a 65 y.o. female.   Patient presents with 1 week history of headache, nonproductive cough, nasal congestion.  Denies any known sick contacts or fever.  Patient has been taking Coricidin HBP over-the-counter with minimal relief.  Denies body aches, chills, nausea, vomiting, diarrhea, chest pain.  Patient does endorse some intermittent shortness of breath.   Cough  History reviewed. No pertinent past medical history.  There are no problems to display for this patient.   History reviewed. No pertinent surgical history.  OB History   No obstetric history on file.      Home Medications    Prior to Admission medications   Medication Sig Start Date End Date Taking? Authorizing Provider  albuterol (VENTOLIN HFA) 108 (90 Base) MCG/ACT inhaler Inhale 1-2 puffs into the lungs every 6 (six) hours as needed for wheezing or shortness of breath. 03/18/21  Yes Sherryll Skoczylas, Hildred Alamin E, FNP  benzonatate (TESSALON) 100 MG capsule Take 1 capsule (100 mg total) by mouth every 8 (eight) hours as needed for cough. 03/18/21  Yes Tylique Aull, Hildred Alamin E, FNP  lisinopril (ZESTRIL) 20 MG tablet Take 20 mg by mouth daily. 02/07/21   [provider]  metFORMIN (GLUCOPHAGE-XR) 500 MG 24 hr tablet 1 tablet    [provider]  pravastatin (PRAVACHOL) 40 MG tablet Take 40 mg by mouth daily. 02/07/21   [provider]  sertraline (ZOLOFT) 100 MG tablet Take 100 mg by mouth daily. 02/07/21   [provider]    Family History History reviewed. No pertinent family history.  Social History Social History   Tobacco Use   Smoking status: Never   Smokeless tobacco: Never     Allergies   Patient has no known allergies.   Review of Systems Review of Systems Per HPI  Physical Exam Triage Vital  Signs ED Triage Vitals  Enc Vitals Group     BP 03/18/21 1152 130/78     Pulse Rate 03/18/21 1152 74     Resp 03/18/21 1152 18     Temp 03/18/21 1152 (!) 97.4 F (36.3 C)     Temp Source 03/18/21 1152 Oral     SpO2 03/18/21 1152 95 %     Weight --      Height --      Head Circumference --      Peak Flow --      Pain Score 03/18/21 1154 9     Pain Loc --      Pain Edu? --      Excl. in [place]? --    No data found.  Updated Vital Signs BP 130/78 (BP Location: Right Arm)   Pulse 74   Temp (!) 97.4 F (36.3 C) (Oral)   Resp 18   SpO2 95%   Visual Acuity Right Eye Distance:   Left Eye Distance:   Bilateral Distance:    Right Eye Near:   Left Eye Near:    Bilateral Near:     Physical Exam Constitutional:      General: She is not in acute distress.    Appearance: Normal appearance. She is not toxic-appearing or diaphoretic.  HENT:     Head: Normocephalic and atraumatic.     Right Ear: Tympanic membrane and ear canal normal.  Left Ear: Tympanic membrane and ear canal normal.     Nose: Congestion present.     Mouth/Throat:     Mouth: Mucous membranes are moist.     Pharynx: No posterior oropharyngeal erythema.  Eyes:     Extraocular Movements: Extraocular movements intact.     Conjunctiva/sclera: Conjunctivae normal.     Pupils: Pupils are equal, round, and reactive to light.  Cardiovascular:     Rate and Rhythm: Normal rate and regular rhythm.     Pulses: Normal pulses.     Heart sounds: Normal heart sounds.  Pulmonary:     Effort: Pulmonary effort is normal. No respiratory distress.     Breath sounds: No stridor. No wheezing, rhonchi or rales.  Abdominal:     General: Abdomen is flat. Bowel sounds are normal.     Palpations: Abdomen is soft.  Musculoskeletal:        General: Normal range of motion.     Cervical back: Normal range of motion.  Skin:    General: Skin is warm and dry.  Neurological:     General: No focal deficit present.     Mental Status:  She is alert and oriented to person, place, and time. Mental status is at baseline.  Psychiatric:        Mood and Affect: Mood normal.        Behavior: Behavior normal.     UC Treatments / Results  Labs (all labs ordered are listed, but only abnormal results are displayed) Labs Reviewed  NOVEL CORONAVIRUS, NAA    EKG   Radiology DG Chest 2 View  Result Date: 03/18/2021 CLINICAL DATA:  Cough and congestion. EXAM: CHEST - 2 VIEW COMPARISON:  None. FINDINGS: The heart size and mediastinal contours are within normal limits. Both lungs are clear. Multilevel degenerative changes in the mid to distal thoracic spine. IMPRESSION: No active cardiopulmonary disease. Electronically Signed   By: Ileana Roup M.D.   On: 03/18/2021 12:29    Procedures Procedures (including critical care time)  Medications Ordered in UC Medications - No data to display  Initial Impression / Assessment and Plan / UC Course  I have reviewed the triage vital signs and the nursing notes.  Pertinent labs & imaging results that were available during my care of the patient were reviewed by me and considered in my medical decision making (see chart for details).     Patient presents with symptoms likely from a viral upper respiratory infection. Differential includes bacterial pneumonia, sinusitis, allergic rhinitis, Covid 19. Do not suspect underlying cardiopulmonary process. Symptoms seem unlikely related to ACS, CHF or COPD exacerbations, pneumonia, pneumothorax. Patient is nontoxic appearing and not in need of emergent medical intervention.  COVID-19 test pending.  Chest x-ray was negative for any acute cardiopulmonary process.   Recommended symptom control with over the counter medications that are safe with high blood pressure.  Benzonatate prescribed to take as needed for cough.  Albuterol inhaler to take as needed for shortness of breath.  Will hold off on prednisone due to patient having history of  diabetes.  Return if symptoms fail to improve in 1-2 weeks or you develop shortness of breath, chest pain, severe headache. Patient states understanding and is agreeable.  Discharged with PCP followup.  Final Clinical Impressions(s) / UC Diagnoses   Final diagnoses:  Viral upper respiratory tract infection with cough     Discharge Instructions      It appears that you have a viral upper  respiratory infection that should resolve in the next few days with symptomatic treatment.  You have been prescribed albuterol inhaler to take as needed for shortness of breath and a cough medication to take as needed.  Your chest x-ray was normal.  Please follow-up if symptoms persist or worsen.     ED Prescriptions     Medication Sig Dispense Auth. Provider   albuterol (VENTOLIN HFA) 108 (90 Base) MCG/ACT inhaler Inhale 1-2 puffs into the lungs every 6 (six) hours as needed for wheezing or shortness of breath. 1 each [place], [place] E, [place]   benzonatate (TESSALON) 100 MG capsule Take 1 capsule (100 mg total) by mouth every 8 (eight) hours as needed for cough. 21 capsule [place], Michele Rockers, [place]      PDMP not reviewed this encounter.   Teodora Medici, [place] 03/18/21 1259

## 2021-03-18 NOTE — ED Triage Notes (Signed)
One week h/o HA, cough and congestion. Has been taking otc meds w/some relief. Denies body aches, n/v/d.

## 2021-03-18 NOTE — Discharge Instructions (Signed)
It appears that you have a viral upper respiratory infection that should resolve in the next few days with symptomatic treatment.  You have been prescribed albuterol inhaler to take as needed for shortness of breath and a cough medication to take as needed.  Your chest x-ray was normal.  Please follow-up if symptoms persist or worsen.

## 2021-03-19 LAB — NOVEL CORONAVIRUS, NAA: SARS-CoV-2, NAA: NOT DETECTED

## 2021-03-30 ENCOUNTER — Ambulatory Visit
Admission: RE | Admit: 2021-03-30 | Discharge: 2021-03-30 | Disposition: A | Discharge status: Home / Self Care | Payer: 59 | Source: Ambulatory Visit | Attending: Family Medicine | Admitting: Family Medicine

## 2021-03-30 DIAGNOSIS — Z1231 Encounter for screening mammogram for malignant neoplasm of breast: Secondary | ICD-10-CM

## 2021-04-12 ENCOUNTER — Telehealth (INDEPENDENT_AMBULATORY_CARE_PROVIDER_SITE_OTHER): Payer: 59 | Admitting: Neurology

## 2021-04-12 ENCOUNTER — Encounter: Payer: Self-pay | Admitting: Neurology

## 2021-04-12 DIAGNOSIS — G5730 Lesion of lateral popliteal nerve, unspecified lower limb: Secondary | ICD-10-CM

## 2021-04-12 MED ORDER — GABAPENTIN 300 MG PO CAPS: 600.0000 mg | ORAL_CAPSULE | Freq: Three times a day (TID) | ORAL | 3 refills | Status: DC

## 2021-04-12 MED ORDER — GABAPENTIN 300 MG PO CAPS: 600.0000 mg | ORAL_CAPSULE | Freq: Three times a day (TID) | ORAL | 6 refills | Status: DC

## 2021-04-12 NOTE — Patient Instructions (Addendum)
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 I also sent her a mychart message and the results of my emg/ncs and my note from today.   Common Peroneal Nerve Entrapment Common peroneal nerve entrapment is a condition that can make it hard to lift a foot. The condition results from pressure on a nerve in the lower leg called the common peroneal nerve. Your common peroneal nerve provides feeling to your outer lower leg and foot. It also supplies the muscles that move your foot and toes upward and outward. What are the causes? This condition may be caused by: Sitting cross-legged, squatting, or kneeling for long periods of time. A hard, direct hit to the outside of the lower leg. Swelling from a knee injury. A break or fracture in one of the lower leg bones. Wearing a boot or cast that ends just below the knee. A growth or cyst near the nerve. What increases the risk? This condition is more likely to develop in people who play: Contact sports, such as football or hockey. Sports where you wear high and stiff boots, such as skiing. What are the signs or symptoms? Symptoms of this condition include: Trouble lifting your foot up (foot drop). Tripping often. Your foot hitting the ground harder than normal as you walk, resulting in a slapping-type sound. Numbness, tingling, or pain in the outside of the knee, outside of the lower leg, and top of the foot. Sensitivity to pressure on the front or outside of the leg. How is this diagnosed? This condition may be diagnosed based on: Your symptoms. Your medical history. A physical exam. Tests, such as: X-rays to check the bones of your knee and leg. MRI to check tendons that attach to the side of your knee. Ultrasound to check for a growth or cyst. Electromyogram (EMG) to check your nerves. During your physical exam, your health care provider will check for numbness in your leg  and test the strength of your lower leg muscles. He or she may tap the side of your lower leg to see if that causes tingling. How is this treated? Treatment for this condition may include: Avoiding activities that make symptoms worse. Using a brace to hold up your foot and toes. Taking anti-inflammatory pain medicines to relieve swelling and lessen pain. Having medicines injected into your ankle joint to lessen pain and swelling. Doing exercises to help you regain or maintain movement (physical therapy). Surgery to take pressure off the nerve. This may be needed if there is no improvement after 2-3 months or if there is a growth pushing on the nerve. Returning gradually to full activity. Follow these instructions at home: If you have a removable brace: Wear the brace as told by your health care provider. Remove it only as told by your health care provider. Check the skin around the brace every day. Tell your health care provider about any concerns. Loosen the brace if your toes tingle, become numb, or turn cold and blue. Keep the brace clean. If the brace is not waterproof: Do not let it get wet. Cover it with a watertight covering when you take a bath or a shower. Activity Ask your health care provider when it is safe to drive if you have a brace on your foot. Do not do any activities that make pain or swelling worse. Do exercises as told by your health care provider. Return to your normal activities as told by your health  care provider. Ask your health care provider what activities are safe for you. General instructions Take over-the-counter and prescription medicines only as told by your health care provider. Do not put your full weight on your knee until your health care provider says you can. Use crutches as directed by your health care provider. Keep all follow-up visits. This is important. How is this prevented? Wear supportive footwear that is appropriate for your athletic  activity. Avoid athletic activities that cause ankle pain or swelling. Wear protective padding over your lower legs when playing contact sports. Make sure your boots do not put extra pressure on the area just below your knees. Do not sit cross-legged for long periods of time. Contact a health care provider if: Your symptoms do not get better in 2-3 months. The weakness or numbness in your leg or foot gets worse. Summary Common peroneal nerve entrapment is a condition that results from pressure on a nerve in the lower leg called the common peroneal nerve. This condition may be caused by a hard hit, swelling, a fracture, or a cyst in the lower leg. Treatment may include rest, a brace, medicines, and physical therapy. Sometimes surgery is needed. Do not do any activities that make pain or swelling worse. This information is not intended to replace advice given to you by your health care provider. Make sure you discuss any questions you have with your health care provider. Document Revised: 12/15/2020 Document Reviewed: 12/15/2020 Elsevier Patient Education  2022 [employer].

## 2021-04-12 NOTE — Progress Notes (Signed)
[facility]    Provider:  Dr Mallory Carter Requesting Provider: Sydnee Cabal, MD Primary Care Provider:  Maury Dus, MD Also send to: Mallory Carter  CC:  Peroneal neuropathy  Virtual Visit via Video Note  I connected with Mallory Carter on 04/12/21 at  9:30 AM EST by a video enabled telemedicine application and verified that I am speaking with the correct person using two identifiers.  Location: Patient: home Provider: office   Follow Up Instructions:    I discussed the assessment and treatment plan with the patient. The patient was provided an opportunity to ask questions and all were answered. The patient agreed with the plan and demonstrated an understanding of the instructions.   The patient was advised to call back or seek an in-person evaluation if the symptoms worsen or if the condition fails to improve as anticipated.  I provided 30 minutes of non-face-to-face time during this encounter.   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 In the meantime can increase gabapentin. See EMG/NCA results below. MRI luba spine showed no etiology. Sent email to her provider at [facility] today, inrease gabapentin:  History:  Mallory Carter 65 year old with tingling in the peroneal nerve distribution and tenderness at the fibular head, bilaterally. MRI of the lumbar spine showed minimal to mild disc degenerative changes and no nerve root compression or spinal stenosis at any of the levels  Summary: EMG/NCS was performed on the bilateral lower extremities.  The left superficial peroneal sensory nerve showed reduced amplitude (5 V, normal greater than 6).  The right superficial peroneal sensory nerve showed reduced amplitude (5 V, normal greater than 6). All remaining nerves (as indicated in the following tables)  were within normal limits. All muscles (as indicated in the following tables) were within normal limits.    Conclusion: Mildly reduced amplitudes of the bilateral peroneal sensory nerves along with her clinical presentation consistent with mild peroneal neuropathy.  Cannot localize based on the results of this test however clinically patient does have tenderness bilaterally at the fibular heads.    HPI:  Mallory Carter is a 65 y.o. female here as requested by  Mallory Cabal, MD for neuropathy. PMHx thyroid disease, sleep apnes, joint pain, HTN, diabetes, depression, arthritis, anxiety, anemia.  I reviewed notes from Dr. Sydnee Carter: She was seen for evaluation of medial meniscus tear with a history of fracture.  It appears she had post right knee arthroscopy, partial medial meniscectomy and chondroplasty which was started after she stepped in a hole in [place] and reported mechanical symptoms such as locking and catching.  Prior to her right knee her surgery she had left knee PMM/PLM/chondroplasty, partial medial and partial lateral meniscectomy and chondroplasty which she describes no pain at 6 weeks follow-up of the surgery.  Patient was seen for follow-up of 6 weeks right knee removal loose body chondroplasty, reported pain level 4 out of 10, had recent PT home exercise program, at the last appointment was not taking any medication, not doing as well if she thought she would, still feels very swollen if she walks a lot or stands a lot, she feels pinpricks from the knee to the ankle at nighttime, unfortunately the right knee was not doing as well as the left knee per Dr. Theda Carter, he stated she has more pain and swelling than he would like on  the right side however the left knee seems to be doing well, she also reports tingling in both lower extremities from the knees down at night, pins-and-needles, no back pain.  He did aspirate a large effusion of the right knee 20 cc of serous fluid, but he also did  believe she was having neuropathic pain in the bilateral lower extremities and referred her here for consult.   She had bilateral knee surgeries. The tingling predates the surgeries. It starts on the lateral sides of the lower legs.and she gets some cramping in the feet. She does not think the surgeries have anything to do with it. About a year ago. It happens at night when she has her feet up or when she is sitting. Happens bilaterally and symmetrically in both legs but sometimes happen in one more than the other but usually both legs. She has low back pain and shooting pain into her buttocks more on the right side goes down the back of the right thigh. She can't stand in one position. She bends her legs at night, doesn't sleep on her back. No leg weakness. No color changes in the feet. Some minimal swelling in the feet. Not painful but uncomfortable. Crossing legs make it worse. She sleeps with legs bent. She has chronic back pain, often it can be severe and she has difficulty getting out of bed and walking long distances she has to sit down. Also rediation into the legs. She has been under the care of a physician for years, tried analgesics, muscle relaxers, pain meds, exercise.  Reviewed notes, labs and imaging from outside physicians, which showed:  I reviewed MRI of the right knee report May 14, 2019 which showed extensive partial and full-thickness cartilage loss on the patella, moderate joint effusion with a 5 mm loose body in the joint.  The loose body in the joint is lying posterior to the root of the posterior horn of the medial meniscus, no Baker's cyst, intact popliteus tendon, articular cartilage of the trochlear groove appears normal, cartilage loss on the patella.  I also reviewed the mri report of the left knee, unknown date, which showed a high-grade radial tear PR millimeters without extrusion, mild to moderate medial tibiofemoral and mild lateral tibiofemoral chondromalacia,  high-grade patellar chondral loss, small effusion and popliteal cyst.  CMP 09/2017 ALT 40, ca 10.5 otherwise normal, BUN/CR 14/0.8  Review of Systems: Patient complains of symptoms per HPI as well as the following symptoms: leg tingling . Pertinent negatives and positives per HPI. 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[employer]: [employer]  Tobacco Use   Smoking status: Never   Smokeless tobacco: Never  Vaping Use   Vaping Use: Never used  Substance and Sexual Activity   Alcohol use: Yes    Comment: "some"   Drug use: No   Sexual activity: Yes    Partners: Male    Birth control/protection: Surgical, Post-menopausal    Comment: btl  Other Topics Concern   Not on file  Social History Narrative   ** Merged History Encounter **       Lives at home with husband Right handed Caffeine: 1-2 cups coffee per day  Social Determinants of Health   Financial Resource Strain: Not on file  Food Insecurity: Not on file  Transportation Needs: Not on file  Physical Activity: Not on file  Stress: Not on file  Social Connections: Not on file  Intimate Partner Violence: Not on file    Family History  Problem Relation Age of Onset   Diabetes Mother    Hypertension Mother    Heart Problems Mother    Lung disease Mother    Cancer Father        skin   Hypertension Father    Diabetes Sister    Diabetes Brother    Hypertension Brother    Heart attack Brother    Heart disease Brother    Diabetes Maternal Grandmother    Diabetes Maternal Grandfather    Stroke Maternal Grandfather    Diabetes Brother    Breast cancer Neg Hx     Past Medical History:  Diagnosis Date   Abnormal Pap smear of cervix    late 1980's    Anemia    Anxiety    Arthritis    Cancer ([place])    skin   Depression    Diabetes ([place])    Dysmenorrhea    Genital warts    GERD (gastroesophageal reflux disease)    High cholesterol    Hx of fracture    Hypertension    Joint pain    Low vitamin D level    Sleep apnea    CPAP   Thyroid disease    no issues lately   Urinary incontinence    occ    Patient Active Problem List   Diagnosis Date Noted   Anxiety 08/18/2020   Body mass index (BMI) 32.0-32.9, adult 08/18/2020   Prediabetes 08/18/2020   Diverticulitis of colon 08/18/2020   Elevated transaminase level 08/18/2020   Family history of ischemic heart disease (IHD) 08/18/2020   Gastro-esophageal reflux disease without esophagitis 08/18/2020   Osteopenia 08/18/2020   Shoulder joint pain 08/18/2020   Type 2 diabetes mellitus with other specified complication ([place]) 41/74/0814   Type 2 diabetes mellitus without complication ([place]) 48/18/5631   Vitamin D deficiency 08/18/2020   Carpal tunnel syndrome of left wrist 03/31/2020   Ulnar neuropathy 03/31/2020   Pain in right knee 04/14/2019   Pain in left knee 02/25/2019   Intramural and subserous leiomyoma of uterus 10/10/2017   Pain in right hand 05/07/2017   Tenosynovitis of right hand 05/07/2017   History of abnormal Pap smear 07/17/2012   HYPERLIPIDEMIA 11/04/2009   Essential hypertension 11/04/2009   Hypothyroidism 11/03/2009   OBSTRUCTIVE SLEEP APNEA 11/03/2009    Past Surgical History:  Procedure Laterality Date   APPENDECTOMY  1990   basal cell and melanoma removal     from skin    bone spur removal     BUNIONECTOMY     CARPAL TUNNEL RELEASE Right 2000s   CESAREAN SECTION  1984, 1988   COLON RESECTION     took 18 inches out   FOOT SURGERY     LASIK     ROTATOR CUFF REPAIR Right 2014   [place], 2000   calcium deposits   TONSILLECTOMY  1963   TUBAL LIGATION      Current Outpatient Medications  Medication Sig Dispense Refill    albuterol (VENTOLIN HFA) 108 (90 Base) MCG/ACT inhaler Inhale 1-2 puffs into the lungs every 6 (six) hours as needed for wheezing or shortness of breath. 1 each 0  Cholecalciferol (VITAMIN D) 2000 UNITS tablet Take 2,000 Units by mouth daily.     gabapentin (NEURONTIN) 300 MG capsule Take 2 capsules (600 mg total) by mouth 3 (three) times daily. 360 capsule 3   lisinopril (PRINIVIL,ZESTRIL) 20 MG tablet Take 20 mg by mouth daily.     metFORMIN (GLUCOPHAGE-XR) 500 MG 24 hr tablet 1 tablet     pravastatin (PRAVACHOL) 40 MG tablet Take 40 mg by mouth daily.     sertraline (ZOLOFT) 100 MG tablet Take 100 mg by mouth daily.     No current facility-administered medications for this visit.    Allergies as of 04/12/2021   (No Known Allergies)    Vitals: LMP 11/01/2011  Last Weight:  Wt Readings from Last 1 Encounters:  08/18/20 184 lb (83.5 kg)   Last Height:   Ht Readings from Last 1 Encounters:  08/18/20 5\' 3"  (1.6 m)     Physical exam: Exam: Gen: NAD, conversant      CV: Denies palpitations or chest pain or SOB. VS: Breathing at a normal rate. Weight appears overweight. Not febrile. Eyes: Conjunctivae clear without exudates or hemorrhage  Neuro: Detailed Neurologic Exam  Speech:    Speech is normal; fluent and spontaneous with normal comprehension.  Cognition:    The patient is oriented to person, place, and time;     recent and remote memory intact;     language fluent;     normal attention, concentration,     fund of knowledge Cranial Nerves:    The pupils are equal, round, and reactive to light. Visual fields are full to finger confrontation. Extraocular movements are intact.  The face is symmetric with normal sensation. The palate elevates in the midline. Hearing intact. Voice is normal. Shoulder shrug is normal. The tongue has normal motion without fasciculations.   Motor Observation:   no involuntary movements noted. Tone:    Appears normal  Posture:    Posture  is normal. normal erect    Strength:    Strength is anti-gravity and symmetric in the upper and lower limbs.      Sensation: intact to LT     Reflex Exam:    Assessment/Plan:  Mallory Carter 65 year old with tingling in the peroneal nerve distribution and tenderness at the fibular head, bilaterally. MRI of the lumbar spine showed minimal to mild disc degenerative changes and no nerve root compression or spinal stenosis at any of the levels  EMG/NCS showed Peroneal neuropathy likely at the fibular heads, she is still symptomatic: Recommend discussion with ortho Leanne Haus on surgical or other interventions. In the meantime can increase gabapentin. See EMG/NCs results below. MRI lubar spine showed no etiology. Sent email to her provider at [facility] today, increase gabapentin, f/u prn:  Summary Mallory Carter 65 year old with tingling in the peroneal nerve distribution and tenderness at the fibular head, bilaterally. MRI of the lumbar spine showed minimal to mild disc degenerative changes and no nerve root compression or spinal stenosis at any of the levels: EMG/NCS was performed on the bilateral lower extremities.  The left superficial peroneal sensory nerve showed reduced amplitude (5 V, normal greater than 6).  The right superficial peroneal sensory nerve showed reduced amplitude (5 V, normal greater than 6). All remaining nerves (as indicated in the following tables) were within normal limits. All muscles (as indicated in the following tables) were within normal limits.    Conclusion: Mildly reduced amplitudes of the bilateral peroneal sensory nerves along with her  clinical presentation consistent with mild peroneal neuropathy.  Cannot localize based on the results of this test however clinically patient does have tenderness bilaterally at the fibular heads.  Meds ordered this encounter  Medications   DISCONTD: gabapentin (NEURONTIN) 300 MG capsule    Sig: Take 2 capsules (600 mg total) by mouth 3 (three)  times daily.    Dispense:  180 capsule    Refill:  6   gabapentin (NEURONTIN) 300 MG capsule    Sig: Take 2 capsules (600 mg total) by mouth 3 (three) times daily.    Dispense:  360 capsule    Refill:  3    Cc: Mallory Dus, MD,   [facility]  Sarina Ill, MD  [facility] [address] [place], [place] [address]  Phone [phone] Fax [phone]  I spent 30 minutes of non-face-to-face time with patient on the  1. Peroneal neuropathy, unspecified laterality    diagnosis.  This included previsit chart review, lab review, study review, order entry, electronic health record documentation, patient education on the different diagnostic and therapeutic options, counseling and coordination of care, risks and benefits of management, compliance, or risk factor reduction

## 2021-08-16 NOTE — Progress Notes (Signed)
66 y.o. G71P2002 Married White or Caucasian Not Hispanic or Latino female here for annual exam.  No vaginal bleeding. No dyspareunia. ? ?Occasional GSI. No bowel c/o. ?  ?H/O DM, last HgbA1C was ~6.1 ? ?Patient's last menstrual period was 11/01/2011.          ?Sexually active: Yes.    ?The current method of family planning is tubal ligation and post menopausal status.    ?Exercising: Yes.     Stretching  ?Smoker:  no ? ?Health Maintenance: ?Pap:   09-19-16 neg HPV HR neg ?History of abnormal Pap:  Yes, f/u was normal.  ?MMG:  03/30/21 Bi-rads 1 neg  ?BMD:   02/24/2019 Osteopenic  ?Colonoscopy: UTD with PCP  ?TDaP:  UD with PCP  ?Gardasil: n/a ? ? reports that she has never smoked. She has never used smokeless tobacco. She reports current alcohol use. She reports that she does not use drugs. Just occasional ETOH. She is retired, worked for [employer]. Husband has Parkinsons. She has 2 daughters and 3 grand children (one month old boy, 86 year old boy and 64 year old girl).  ? ?Past Medical History:  ?Diagnosis Date  ? Abnormal Pap smear of cervix   ? late 1980's  ? Anemia   ? Anxiety   ? Arthritis   ? Cancer [facility])   ? skin  ? Depression   ? Diabetes ([place])   ? Dysmenorrhea   ? Genital warts   ? GERD (gastroesophageal reflux disease)   ? High cholesterol   ? Hx of fracture   ? Hypertension   ? Joint pain   ? Low vitamin D level   ? Sleep apnea   ? CPAP  ? Thyroid disease   ? no issues lately  ? Urinary incontinence   ? occ  ? ? ?Past Surgical History:  ?Procedure Laterality Date  ? APPENDECTOMY  1990  ? basal cell and melanoma removal    ? from skin   ? bone spur removal    ? BUNIONECTOMY    ? CARPAL TUNNEL RELEASE Right 2000s  ? [place]  ? COLON RESECTION    ? took 18 inches out  ? FOOT SURGERY    ? LASIK    ? ROTATOR CUFF REPAIR Right 2014  ? [place], 2000  ? calcium deposits  ? TONSILLECTOMY  1963  ? TUBAL LIGATION    ? ? ?Current Outpatient Medications  ?Medication Sig Dispense Refill  ?  Cholecalciferol (VITAMIN D) 2000 UNITS tablet Take 2,000 Units by mouth daily.    ? lisinopril (PRINIVIL,ZESTRIL) 20 MG tablet Take 20 mg by mouth daily.    ? metFORMIN (GLUCOPHAGE-XR) 500 MG 24 hr tablet 1 tablet    ? pravastatin (PRAVACHOL) 40 MG tablet Take 40 mg by mouth daily.    ? sertraline (ZOLOFT) 100 MG tablet Take 100 mg by mouth daily.    ? ?No current facility-administered medications for this visit.  ? ? ?Family History  ?Problem Relation Age of Onset  ? Diabetes Mother   ? Hypertension Mother   ? Heart Problems Mother   ? Lung disease Mother   ? Cancer Father   ?     skin  ? Hypertension Father   ? Diabetes Sister   ? Diabetes Brother   ? Hypertension Brother   ? Heart attack Brother   ? Heart disease Brother   ? Diabetes Maternal Grandmother   ? Diabetes Maternal  Grandfather   ? Stroke Maternal Grandfather   ? Diabetes Brother   ? Breast cancer Neg Hx   ? ? ?Review of Systems  ?All other systems reviewed and are negative. ? ?Exam:   ?BP 110/68   Pulse 72   Ht 5' 2.75" (1.594 m)   Wt 177 lb (80.3 kg)   LMP 11/01/2011   SpO2 98%   BMI 31.60 kg/m?   Weight change: '@WEIGHTCHANGE'$ @ Height:   Height: 5' 2.75" (159.4 cm)  ?Ht Readings from Last 3 Encounters:  ?08/23/21 5' 2.75" (1.594 m)  ?08/18/20 '5\' 3"'$  (1.6 m)  ?08/20/19 '5\' 3"'$  (1.6 m)  ? ? ?General appearance: alert, cooperative and appears stated age ?Head: Normocephalic, without obvious abnormality, atraumatic ?Neck: no adenopathy, supple, symmetrical, trachea midline and thyroid normal to inspection and palpation ?Lungs: clear to auscultation bilaterally ?Cardiovascular: regular rate and rhythm ?Breasts: normal appearance, no masses or tenderness ?Abdomen: soft, non-tender; non distended,  no masses,  no organomegaly ?Extremities: extremities normal, atraumatic, no cyanosis or edema ?Skin: Skin color, texture, turgor normal. No rashes or lesions ?Lymph nodes: Cervical, supraclavicular, and axillary nodes normal. ?No abnormal inguinal nodes  palpated ?Neurologic: Grossly normal ? ? ?Pelvic: External genitalia:  no lesions ?             Urethra:  normal appearing urethra with no masses, tenderness or lesions ?             Bartholins and Skenes: normal    ?             Vagina: normal appearing vagina with normal color and discharge, no lesions ?             Cervix: no lesions ?              ?Bimanual Exam:  Uterus:   no masses or tenderness ?             Adnexa: no mass, fullness, tenderness ?              Rectovaginal: Confirms ?              Anus:  normal sphincter tone, no lesions ? ?Gae Dry chaperoned for the exam. ? ?1. Well woman exam ?Discussed breast self exam ?Discussed calcium and vit D intake ?Labs with primary ?Colonoscopy with primary ?Mammogram UTD ? ?2. Screening for cervical cancer ?- Cytology - PAP ? ? ? ?

## 2021-08-23 ENCOUNTER — Encounter: Payer: Self-pay | Admitting: Obstetrics and Gynecology

## 2021-08-23 ENCOUNTER — Ambulatory Visit (INDEPENDENT_AMBULATORY_CARE_PROVIDER_SITE_OTHER): Payer: 59 | Admitting: Obstetrics and Gynecology

## 2021-08-23 ENCOUNTER — Other Ambulatory Visit (HOSPITAL_COMMUNITY)
Admission: RE | Admit: 2021-08-23 | Discharge: 2021-08-23 | Disposition: A | Discharge status: Home / Self Care | Payer: 59 | Source: Ambulatory Visit | Attending: Obstetrics and Gynecology | Admitting: Obstetrics and Gynecology

## 2021-08-23 VITALS — BP 110/68 | HR 72 | Ht 62.75 in | Wt 177.0 lb

## 2021-08-23 DIAGNOSIS — Z124 Encounter for screening for malignant neoplasm of cervix: Secondary | ICD-10-CM | POA: Diagnosis present

## 2021-08-23 DIAGNOSIS — Z01419 Encounter for gynecological examination (general) (routine) without abnormal findings: Secondary | ICD-10-CM

## 2021-08-23 NOTE — Patient Instructions (Signed)
EXERCISE   We recommended that you start or continue a regular exercise program for good health. Physical activity is anything that gets your body moving, some is better than none. The CDC recommends 150 minutes per week of Moderate-Intensity Aerobic Activity and 2 or more days of Muscle Strengthening Activity. ? ?Benefits of exercise are limitless: helps weight loss/weight maintenance, improves mood and energy, helps with depression and anxiety, improves sleep, tones and strengthens muscles, improves balance, improves bone density, protects from chronic conditions such as heart disease, high blood pressure and diabetes and so much more. ?To learn more visit: https://www.cdc.gov/physicalactivity/index.html ? ?DIET: Good nutrition starts with a healthy diet of fruits, vegetables, whole grains, and lean protein sources. Drink plenty of water for hydration. Minimize empty calories, sodium, sweets. For more information about dietary recommendations visit: https://health.gov/our-work/nutrition-physical-activity/dietary-guidelines and https://www.myplate.gov/ ? ?ALCOHOL:  Women should limit their alcohol intake to no more than 7 drinks/beers/glasses of wine (combined, not each!) per week. Moderation of alcohol intake to this level decreases your risk of breast cancer and liver damage.  If you are concerned that you may have a problem, or your friends have told you they are concerned about your drinking, there are many resources to help. A well-known program that is free, effective, and available to all people all over the nation is Alcoholics Anonymous.  Check out this site to learn more: https://www.aa.org/ ? ? ?CALCIUM AND VITAMIN D:  Adequate intake of calcium and Vitamin D are recommended for bone health.  You should be getting between 1000-1200 mg of calcium and 800 units of Vitamin D daily between diet and supplements ? ?PAP SMEARS:  Pap smears, to check for cervical cancer or precancers,  have traditionally been  done yearly, scientific advances have shown that most women can have pap smears less often.  However, every woman still should have a physical exam from her gynecologist every year. It will include a breast check, inspection of the vulva and vagina to check for abnormal growths or skin changes, a visual exam of the cervix, and then an exam to evaluate the size and shape of the uterus and ovaries. We will also provide age appropriate advice regarding health maintenance, like when you should have certain vaccines, screening for sexually transmitted diseases, bone density testing, colonoscopy, mammograms, etc.  ? ?MAMMOGRAMS:  All women over 40 years old should have a routine mammogram.  ? ?COLON CANCER SCREENING: Now recommend starting at age 45. At this time colonoscopy is not covered for routine screening until 50. There are take home tests that can be done between 45-49.  ? ?COLONOSCOPY:  Colonoscopy to screen for colon cancer is recommended for all women at age 50.  We know, you hate the idea of the prep.  We agree, BUT, having colon cancer and not knowing it is worse!!  Colon cancer so often starts as a polyp that can be seen and removed at colonscopy, which can quite literally save your life!  And if your first colonoscopy is normal and you have no family history of colon cancer, most women don't have to have it again for 10 years.  Once every ten years, you can do something that may end up saving your life, right?  We will be happy to help you get it scheduled when you are ready.  Be sure to check your insurance coverage so you understand how much it will cost.  It may be covered as a preventative service at no cost, but you should check   your particular policy.   ? ? ? ?Breast Self-Awareness ?Breast self-awareness means being familiar with how your breasts look and feel. It involves checking your breasts regularly and reporting any changes to your health care provider. ?Practicing breast self-awareness is  important. A change in your breasts can be a sign of a serious medical problem. Being familiar with how your breasts look and feel allows you to find any problems early, when treatment is more likely to be successful. All women should practice breast self-awareness, including women who have had breast implants. ?How to do a breast self-exam ?One way to learn what is normal for your breasts and whether your breasts are changing is to do a breast self-exam. To do a breast self-exam: ?Look for Changes ? ?Remove all the clothing above your waist. ?Stand in front of a mirror in a room with good lighting. ?Put your hands on your hips. ?Push your hands firmly downward. ?Compare your breasts in the mirror. Look for differences between them (asymmetry), such as: ?Differences in shape. ?Differences in size. ?Puckers, dips, and bumps in one breast and not the other. ?Look at each breast for changes in your skin, such as: ?Redness. ?Scaly areas. ?Look for changes in your nipples, such as: ?Discharge. ?Bleeding. ?Dimpling. ?Redness. ?A change in position. ?Feel for Changes ?Carefully feel your breasts for lumps and changes. It is best to do this while lying on your back on the floor and again while sitting or standing in the shower or tub with soapy water on your skin. Feel each breast in the following way: ?Place the arm on the side of the breast you are examining above your head. ?Feel your breast with the other hand. ?Start in the nipple area and make ? inch (2 cm) overlapping circles to feel your breast. Use the pads of your three middle fingers to do this. Apply light pressure, then medium pressure, then firm pressure. The light pressure will allow you to feel the tissue closest to the skin. The medium pressure will allow you to feel the tissue that is a little deeper. The firm pressure will allow you to feel the tissue close to the ribs. ?Continue the overlapping circles, moving downward over the breast until you feel your  ribs below your breast. ?Move one finger-width toward the center of the body. Continue to use the ? inch (2 cm) overlapping circles to feel your breast as you move slowly up toward your collarbone. ?Continue the up and down exam using all three pressures until you reach your armpit. ? ?Write Down What You Find ? ?Write down what is normal for each breast and any changes that you find. Keep a written record with breast changes or normal findings for each breast. By writing this information down, you do not need to depend only on memory for size, tenderness, or location. Write down where you are in your menstrual cycle, if you are still menstruating. ?If you are having trouble noticing differences in your breasts, do not get discouraged. With time you will become more familiar with the variations in your breasts and more comfortable with the exam. ?How often should I examine my breasts? ?Examine your breasts every month. If you are breastfeeding, the best time to examine your breasts is after a feeding or after using a breast pump. If you menstruate, the best time to examine your breasts is 5-7 days after your period is over. During your period, your breasts are lumpier, and it may be more   difficult to notice changes. ?When should I see my health care provider? ?See your health care provider if you notice: ?A change in shape or size of your breasts or nipples. ?A change in the skin of your breast or nipples, such as a reddened or scaly area. ?Unusual discharge from your nipples. ?A lump or thick area that was not there before. ?Pain in your breasts. ?Anything that concerns you. ?Kegel Exercises ? ?Kegel exercises can help strengthen your pelvic floor muscles. The pelvic floor is a group of muscles that support your rectum, small intestine, and bladder. In females, pelvic floor muscles also help support the uterus. These muscles help you control the flow of urine and stool (feces). ?Kegel exercises are painless and  simple. They do not require any equipment. Your provider may suggest Kegel exercises to: ?Improve bladder and bowel control. ?Improve sexual response. ?Improve weak pelvic floor muscles after surgery to remove

## 2021-08-25 LAB — CYTOLOGY - PAP
Comment: NEGATIVE
Diagnosis: NEGATIVE
High risk HPV: NEGATIVE

## 2021-10-29 NOTE — Progress Notes (Unsigned)
Cardiology Office Note:    Date:  10/31/2021   ID:  Otilio Jefferson Kosiba, DOB [birth year], MRN [id number]  PCP:  Maury Dus, [place] Providers Cardiologist:  None {   Referring MD: Almedia Balls, NP    History of Present Illness:    Mallory Carter is a 66 y.o. female with a hx of HTN, HLD, DMII, OSA on CPAP, and anxiety who was referred by Almedia Balls, NP for family history of CAD.  Today, the patient overall feels okay. She is hoping to have an evaluation due to very strong family history of heart disease. Mother with MI and CHF at age 51,  maternal aunt with 39 with stroke, brother with MI at 6. She is currently doing well from a CV standpoint. No chest pain, SOB, orthopnea, PND, orthopnea. Occasional palpitations with anxiety. Mobility is limited due to orthopedic pain, but she is able to do exercise in the pool without significant exertional symptoms.   Past Medical History:  Diagnosis Date   Abnormal Pap smear of cervix    late 1980's   Anemia    Anxiety    Arthritis    Cancer ([place])    skin   Depression    Diabetes (H[facility])    Dysmenorrhea    Genital warts    GERD (gastroesophageal reflux disease)    High cholesterol    Hx of fracture    Hypertension    Joint pain    Low vitamin D level    Sleep apnea    CPAP   Thyroid disease    no issues lately   Urinary incontinence    occ    Past Surgical History:  Procedure Laterality Date   APPENDECTOMY  1990   basal cell and melanoma removal     from skin    bone spur removal     BUNIONECTOMY     CARPAL TUNNEL RELEASE Right 2000s   CESAREAN SECTION  1984, 1988   COLON RESECTION     took 18 inches out   FOOT SURGERY     LASIK     ROTATOR CUFF REPAIR Right 2014   [place], 2000   calcium deposits   TONSILLECTOMY  1963   TUBAL LIGATION      Current Medications: Current Meds  Medication Sig   Cholecalciferol (VITAMIN D) 2000 UNITS tablet Take 2,000 Units by mouth daily.    lisinopril (PRINIVIL,ZESTRIL) 20 MG tablet Take 20 mg by mouth daily.   metFORMIN (GLUCOPHAGE-XR) 500 MG 24 hr tablet 500 mg 2 (two) times daily.   pravastatin (PRAVACHOL) 40 MG tablet Take 40 mg by mouth daily.   predniSONE (DELTASONE) 10 MG tablet Take 10 mg by mouth 2 (two) times daily.   sertraline (ZOLOFT) 100 MG tablet Take 100 mg by mouth daily.     Allergies:   Patient has no known allergies.   Social History   Socioeconomic History   Marital status: Married    Spouse name: Not on file   Number of children: 2   Years of education: Not on file   Highest education level: Some college, no degree  Occupational History   Occupation: Web designer with [employer]    Employer: [employer]  Tobacco Use   Smoking status: Never   Smokeless tobacco: Never  Vaping Use   Vaping Use: Never used  Substance and Sexual Activity   Alcohol use: Yes    Comment: "some"  Drug use: No   Sexual activity: Yes    Partners: Male    Birth control/protection: Surgical, Post-menopausal    Comment: btl  Other Topics Concern   Not on file  Social History Narrative   ** Merged History Encounter **       Lives at home with husband Right handed Caffeine: 1-2 cups coffee per day    Social Determinants of Health   Financial Resource Strain: Not on file  Food Insecurity: Not on file  Transportation Needs: Not on file  Physical Activity: Not on file  Stress: Not on file  Social Connections: Not on file     Family History: The patient's family history includes Cancer in her father; Diabetes in her brother, brother, maternal grandfather, maternal grandmother, mother, and sister; Heart Problems in her mother; Heart attack in her brother; Heart disease in her brother; Hypertension in her brother, father, and mother; Lung disease in her mother; Stroke in her maternal grandfather. There is no history of Breast cancer.  ROS:   Please see the history of present illness.     All other systems  reviewed and are negative.  EKGs/Labs/Other Studies Reviewed:    The following studies were reviewed today:  EKG:  EKG is  ordered today.  The ekg ordered today demonstrates NSR HR 70  Recent Labs: No results found for requested labs within last 365 days.  Recent Lipid Panel No results found for: "CHOL", "TRIG", "HDL", "CHOLHDL", "VLDL", "LDLCALC", "LDLDIRECT"   Risk Assessment/Calculations:           Physical Exam:    VS:  BP 120/70 (BP Location: Left Arm, Patient Position: Sitting, Cuff Size: Normal)   Pulse 70   Ht 5' 2.75" (1.594 m)   Wt 177 lb (80.3 kg)   LMP 07/17/2011   SpO2 95%   BMI 31.60 kg/m     Wt Readings from Last 3 Encounters:  10/31/21 177 lb (80.3 kg)  08/23/21 177 lb (80.3 kg)  08/18/20 184 lb (83.5 kg)     GEN:  Well nourished, well developed in no acute distress HEENT: Normal NECK: No JVD; No carotid bruits CARDIAC: RRR, 2/6 systolic murmur RESPIRATORY:  Clear to auscultation without rales, wheezing or rhonchi  ABDOMEN: Soft, non-tender, non-distended MUSCULOSKELETAL:  No edema; No deformity  SKIN: Warm and dry NEUROLOGIC:  Alert and oriented x 3 PSYCHIATRIC:  Normal affect   ASSESSMENT:    1. Family history of early CAD   2. Essential hypertension   3. Family history of ischemic heart disease (IHD)   4. Hyperlipidemia, unspecified hyperlipidemia type   5. Medication management   6. Murmur    PLAN:    In order of problems listed above:  #Family History of CAD: Very strong family history of CAD with mother with MI and CHF in 67s, brother with MI in 65s, maternal aunt with MI in 59s. She is currently asymptomatic and fairly active. Will check NMR panel, Lp(a) and Ca score for further evaluation and risk stratification. -Check Ca score -Check NMR panel, Lp(a)  #HTN: Well controlled on lisinopril -Continue lisinopril '20mg'$  daily  #HLD: -Continue pravastatin '40mg'$  daily -LDL 91, TG 144 on 09/2021 -Can adjust pending CT  findings  #Systolic Murmur: -Check TTE     Medication Adjustments/Labs and Tests Ordered: Current medicines are reviewed at length with the patient today.  Concerns regarding medicines are outlined above.  Orders Placed This Encounter  Procedures   CT CARDIAC SCORING (SELF PAY ONLY)  NMR, lipoprofile   Lipoprotein A (LPA)   Apolipoprotein B   EKG 12-Lead   ECHOCARDIOGRAM COMPLETE   No orders of the defined types were placed in this encounter.   Patient Instructions  Medication Instructions:   Your physician recommends that you continue on your current medications as directed. Please refer to the Current Medication list given to you today.  *If you need a refill on your cardiac medications before your next appointment, please call your pharmacy*   Lab Work:  1.) SOMETIME THIS WEEK HERE IN THE OFFICE--CHECK NMR LIPOPROFILE, LIPOPROTEIN A, AND APOLIPOPROTEIN B--PLEASE COME FASTING TO THIS LAB APPOINTMENT  If you have labs (blood work) drawn today and your tests are completely normal, you will receive your results only by: [place] (if you have MyChart) OR A paper copy in the mail If you have any lab test that is abnormal or we need to change your treatment, we will call you to review the results.   Testing/Procedures:  2.) Your physician has requested that you have an echocardiogram. Echocardiography is a painless test that uses sound waves to create images of your heart. It provides your doctor with information about the size and shape of your heart and how well your heart's chambers and valves are working. This procedure takes approximately one hour. There are no restrictions for this procedure.  3.) CARDIAC CALCIUM SCORE (SELF PAY)    Follow-Up: At [facility], you and your health needs are our priority.  As part of our continuing mission to provide you with exceptional heart care, we have created designated Provider Care Teams.  These Care Teams include your  primary Cardiologist (physician) and Advanced Practice Providers (APPs -  Physician Assistants and Nurse Practitioners) who all work together to provide you with the care you need, when you need it.  We recommend signing up for the patient portal called "MyChart".  Sign up information is provided on this After Visit Summary.  MyChart is used to connect with patients for Virtual Visits (Telemedicine).  Patients are able to view lab/test results, encounter notes, upcoming appointments, etc.  Non-urgent messages can be sent to your provider as well.   To learn more about what you can do with MyChart, go to [email or web address].    Your next appointment:   6 month(s)  The format for your next appointment:   In Person  Provider:   DR. Johney Frame    Important Information About Sugar         Signed, Freada Bergeron, MD  10/31/2021 3:07 PM    [place]

## 2021-10-31 ENCOUNTER — Ambulatory Visit: Payer: 59 | Admitting: Cardiology

## 2021-10-31 ENCOUNTER — Encounter: Payer: Self-pay | Admitting: Cardiology

## 2021-10-31 VITALS — BP 120/70 | HR 70 | Ht 62.75 in | Wt 177.0 lb

## 2021-10-31 DIAGNOSIS — I10 Essential (primary) hypertension: Secondary | ICD-10-CM | POA: Diagnosis not present

## 2021-10-31 DIAGNOSIS — E785 Hyperlipidemia, unspecified: Secondary | ICD-10-CM | POA: Diagnosis not present

## 2021-10-31 DIAGNOSIS — Z79899 Other long term (current) drug therapy: Secondary | ICD-10-CM

## 2021-10-31 DIAGNOSIS — R011 Cardiac murmur, unspecified: Secondary | ICD-10-CM

## 2021-10-31 DIAGNOSIS — Z8249 Family history of ischemic heart disease and other diseases of the circulatory system: Secondary | ICD-10-CM

## 2021-10-31 NOTE — Patient Instructions (Signed)
Medication Instructions:   Your physician recommends that you continue on your current medications as directed. Please refer to the Current Medication list given to you today.  *If you need a refill on your cardiac medications before your next appointment, please call your pharmacy*   Lab Work:  1.) SOMETIME THIS WEEK HERE IN THE OFFICE--CHECK NMR LIPOPROFILE, LIPOPROTEIN A, AND APOLIPOPROTEIN B--PLEASE COME FASTING TO THIS LAB APPOINTMENT  If you have labs (blood work) drawn today and your tests are completely normal, you will receive your results only by: [place] (if you have MyChart) OR A paper copy in the mail If you have any lab test that is abnormal or we need to change your treatment, we will call you to review the results.   Testing/Procedures:  2.) Your physician has requested that you have an echocardiogram. Echocardiography is a painless test that uses sound waves to create images of your heart. It provides your doctor with information about the size and shape of your heart and how well your heart's chambers and valves are working. This procedure takes approximately one hour. There are no restrictions for this procedure.  3.) CARDIAC CALCIUM SCORE (SELF PAY)    Follow-Up: At [facility], you and your health needs are our priority.  As part of our continuing mission to provide you with exceptional heart care, we have created designated Provider Care Teams.  These Care Teams include your primary Cardiologist (physician) and Advanced Practice Providers (APPs -  Physician Assistants and Nurse Practitioners) who all work together to provide you with the care you need, when you need it.  We recommend signing up for the patient portal called "MyChart".  Sign up information is provided on this After Visit Summary.  MyChart is used to connect with patients for Virtual Visits (Telemedicine).  Patients are able to view lab/test results, encounter notes, upcoming appointments,  etc.  Non-urgent messages can be sent to your provider as well.   To learn more about what you can do with MyChart, go to [email or web address].    Your next appointment:   6 month(s)  The format for your next appointment:   In Person  Provider:   DR. Johney Frame    Important Information About Sugar

## 2021-11-02 ENCOUNTER — Other Ambulatory Visit: Payer: 59

## 2021-11-02 DIAGNOSIS — E785 Hyperlipidemia, unspecified: Secondary | ICD-10-CM

## 2021-11-02 DIAGNOSIS — Z79899 Other long term (current) drug therapy: Secondary | ICD-10-CM

## 2021-11-02 DIAGNOSIS — I10 Essential (primary) hypertension: Secondary | ICD-10-CM

## 2021-11-02 DIAGNOSIS — R011 Cardiac murmur, unspecified: Secondary | ICD-10-CM

## 2021-11-02 DIAGNOSIS — Z8249 Family history of ischemic heart disease and other diseases of the circulatory system: Secondary | ICD-10-CM

## 2021-11-03 LAB — NMR, LIPOPROFILE
Cholesterol, Total: 167 mg/dL (ref 100–199)
HDL Particle Number: 45.4 umol/L (ref >=30.5)
HDL-C: 73 mg/dL (ref >39)
LDL Particle Number: 829 nmol/L (ref <1000)
LDL Size: 20.7 nm (ref >20.5)
LDL-C (NIH Calc): 74 mg/dL (ref 0–99)
LP-IR Score: 42 (ref <=45)
Small LDL Particle Number: 379 nmol/L (ref <=527)
Triglycerides: 113 mg/dL (ref 0–149)

## 2021-11-03 LAB — LIPOPROTEIN A (LPA): Lipoprotein (a): 9.9 nmol/L (ref <75.0)

## 2021-11-03 LAB — APOLIPOPROTEIN B: Apolipoprotein B: 73 mg/dL (ref <90)

## 2021-11-07 ENCOUNTER — Ambulatory Visit (HOSPITAL_COMMUNITY)
Admission: RE | Admit: 2021-11-07 | Discharge: 2021-11-07 | Disposition: A | Discharge status: Home / Self Care | Payer: 59 | Source: Ambulatory Visit | Attending: Cardiology | Admitting: Cardiology

## 2021-11-07 DIAGNOSIS — E785 Hyperlipidemia, unspecified: Secondary | ICD-10-CM | POA: Insufficient documentation

## 2021-11-07 DIAGNOSIS — R011 Cardiac murmur, unspecified: Secondary | ICD-10-CM | POA: Insufficient documentation

## 2021-11-07 DIAGNOSIS — Z8249 Family history of ischemic heart disease and other diseases of the circulatory system: Secondary | ICD-10-CM | POA: Insufficient documentation

## 2021-11-08 ENCOUNTER — Telehealth: Payer: Self-pay | Admitting: *Deleted

## 2021-11-08 DIAGNOSIS — Z8249 Family history of ischemic heart disease and other diseases of the circulatory system: Secondary | ICD-10-CM

## 2021-11-08 DIAGNOSIS — Z79899 Other long term (current) drug therapy: Secondary | ICD-10-CM

## 2021-11-08 DIAGNOSIS — E785 Hyperlipidemia, unspecified: Secondary | ICD-10-CM

## 2021-11-08 DIAGNOSIS — I2510 Atherosclerotic heart disease of native coronary artery without angina pectoris: Secondary | ICD-10-CM

## 2021-11-08 MED ORDER — PRAVASTATIN SODIUM 80 MG PO TABS: 80.0000 mg | ORAL_TABLET | Freq: Every day | ORAL | 3 refills | Status: DC

## 2021-11-08 NOTE — Telephone Encounter (Signed)
-----   Message from Freada Bergeron, MD sent at 11/07/2021  7:58 PM EDT ----- Her Ca score is 38.6 which is the 75% for age, gender, race matched controls. This means she has some plaque present and we need to prevent this plaque from worsening. To do this, we should increase her pravastatin to '80mg'$  daily with goal of driving her MEQ<68. If we are unable to get there with pravastatin, we can switch her to a stronger statin. Repeat lipids in 6-8 weeks on the new dose.

## 2021-11-08 NOTE — Telephone Encounter (Signed)
The patient has been notified of the result and verbalized understanding.  All questions (if any) were answered.  Pt aware to increase her pravastatin to 80 mg po daily and come in for repeat lipids in 6-8 weeks.   She is aware her LDL goal is <70, and if pravastatin 80 mg doesn't get her there at next lab check, then Dr. Johney Frame will switch her to a stronger statin thereafter.  Confirmed the pharmacy of choice with the pt.   Scheduled the pt a repeat lab appt to recheck lipids in 6-8 weeks on 12/20/21.  Pt aware to come fasting to this lab appt.   Pt verbalized understanding and agrees with this plan.

## 2021-11-14 ENCOUNTER — Ambulatory Visit (HOSPITAL_COMMUNITY): Payer: 59 | Attending: Cardiology

## 2021-11-14 DIAGNOSIS — R011 Cardiac murmur, unspecified: Secondary | ICD-10-CM | POA: Insufficient documentation

## 2021-11-14 LAB — ECHOCARDIOGRAM COMPLETE
Area-P 1/2: 3.51 cm2
S' Lateral: 3 cm

## 2021-11-30 ENCOUNTER — Other Ambulatory Visit: Payer: Self-pay | Admitting: *Deleted

## 2021-11-30 DIAGNOSIS — I2510 Atherosclerotic heart disease of native coronary artery without angina pectoris: Secondary | ICD-10-CM

## 2021-11-30 DIAGNOSIS — Z8249 Family history of ischemic heart disease and other diseases of the circulatory system: Secondary | ICD-10-CM

## 2021-11-30 DIAGNOSIS — E785 Hyperlipidemia, unspecified: Secondary | ICD-10-CM

## 2021-11-30 DIAGNOSIS — Z79899 Other long term (current) drug therapy: Secondary | ICD-10-CM

## 2021-11-30 MED ORDER — PRAVASTATIN SODIUM 80 MG PO TABS: 80.0000 mg | ORAL_TABLET | Freq: Every day | ORAL | 3 refills | Status: DC

## 2021-12-02 DIAGNOSIS — L821 Other seborrheic keratosis: Secondary | ICD-10-CM | POA: Diagnosis not present

## 2021-12-02 DIAGNOSIS — L570 Actinic keratosis: Secondary | ICD-10-CM | POA: Diagnosis not present

## 2021-12-02 DIAGNOSIS — D1801 Hemangioma of skin and subcutaneous tissue: Secondary | ICD-10-CM | POA: Diagnosis not present

## 2021-12-02 DIAGNOSIS — L814 Other melanin hyperpigmentation: Secondary | ICD-10-CM | POA: Diagnosis not present

## 2021-12-02 DIAGNOSIS — Z85828 Personal history of other malignant neoplasm of skin: Secondary | ICD-10-CM | POA: Diagnosis not present

## 2021-12-02 DIAGNOSIS — Z08 Encounter for follow-up examination after completed treatment for malignant neoplasm: Secondary | ICD-10-CM | POA: Diagnosis not present

## 2021-12-20 ENCOUNTER — Ambulatory Visit: Payer: 59 | Attending: Cardiology

## 2021-12-20 DIAGNOSIS — E785 Hyperlipidemia, unspecified: Secondary | ICD-10-CM

## 2021-12-20 DIAGNOSIS — I2510 Atherosclerotic heart disease of native coronary artery without angina pectoris: Secondary | ICD-10-CM | POA: Diagnosis not present

## 2021-12-20 DIAGNOSIS — Z79899 Other long term (current) drug therapy: Secondary | ICD-10-CM

## 2021-12-20 DIAGNOSIS — Z8249 Family history of ischemic heart disease and other diseases of the circulatory system: Secondary | ICD-10-CM | POA: Diagnosis not present

## 2021-12-20 LAB — LIPID PANEL
Chol/HDL Ratio: 2.5 ratio (ref 0.0–4.4)
Cholesterol, Total: 150 mg/dL (ref 100–199)
HDL: 59 mg/dL (ref >39)
LDL Chol Calc (NIH): 70 mg/dL (ref 0–99)
Triglycerides: 116 mg/dL (ref 0–149)
VLDL Cholesterol Cal: 21 mg/dL (ref 5–40)

## 2021-12-22 ENCOUNTER — Telehealth: Payer: Self-pay | Admitting: *Deleted

## 2021-12-22 MED ORDER — PRAVASTATIN SODIUM 40 MG PO TABS: 40.0000 mg | ORAL_TABLET | Freq: Every day | ORAL | 3 refills | Status: DC

## 2021-12-22 MED ORDER — EZETIMIBE 10 MG PO TABS: 10.0000 mg | ORAL_TABLET | Freq: Every day | ORAL | 3 refills | Status: DC

## 2021-12-22 NOTE — Telephone Encounter (Signed)
-----   Message from Freada Bergeron, MD sent at 12/21/2021  7:57 PM EDT ----- We can decrease her pravastatin to '40mg'$  daily and add zetia so we can keep the LDL at goal. If this does not work, we can get her into lipid clinic. ----- Message ----- From: Nuala Alpha, LPN Sent: 0/06/7953   4:40 PM EDT To: Nuala Alpha, LPN; Freada Bergeron, MD  Please advise- pravastatin is causing muscle aches and knee pains. She is asking should she switch to alternative regimen or reduce the dose of her pravastatin?  Thanks!   ----- Message ----- From: Freada Bergeron, MD Sent: 12/21/2021   4:25 PM EDT To: Nuala Alpha, LPN  Cholesterol looks great!

## 2021-12-22 NOTE — Telephone Encounter (Signed)
The patient has been notified of the result and verbalized understanding.  All questions (if any) were answered.  Pt aware we will decrease her pravastatin to the 40 mg po daily dose and add zetia 10 mg po daily to her regimen.  She is aware to keep [place] posted if this does not help, for we will refer her to lipid clinic thereafter for further management.   Confirmed the pharmacy of choice with the pt.   Pt verbalized understanding and agrees with this plan.

## 2022-01-11 DIAGNOSIS — I10 Essential (primary) hypertension: Secondary | ICD-10-CM | POA: Diagnosis not present

## 2022-01-11 DIAGNOSIS — R69 Illness, unspecified: Secondary | ICD-10-CM | POA: Diagnosis not present

## 2022-01-11 DIAGNOSIS — E119 Type 2 diabetes mellitus without complications: Secondary | ICD-10-CM | POA: Diagnosis not present

## 2022-01-11 DIAGNOSIS — Z85820 Personal history of malignant melanoma of skin: Secondary | ICD-10-CM | POA: Diagnosis not present

## 2022-01-11 DIAGNOSIS — E669 Obesity, unspecified: Secondary | ICD-10-CM | POA: Diagnosis not present

## 2022-01-11 DIAGNOSIS — Z7984 Long term (current) use of oral hypoglycemic drugs: Secondary | ICD-10-CM | POA: Diagnosis not present

## 2022-01-11 DIAGNOSIS — Z6831 Body mass index (BMI) 31.0-31.9, adult: Secondary | ICD-10-CM | POA: Diagnosis not present

## 2022-01-11 DIAGNOSIS — E785 Hyperlipidemia, unspecified: Secondary | ICD-10-CM | POA: Diagnosis not present

## 2022-03-14 ENCOUNTER — Other Ambulatory Visit: Payer: Self-pay | Admitting: Family Medicine

## 2022-03-14 DIAGNOSIS — Z1231 Encounter for screening mammogram for malignant neoplasm of breast: Secondary | ICD-10-CM

## 2022-03-31 DIAGNOSIS — R69 Illness, unspecified: Secondary | ICD-10-CM | POA: Diagnosis not present

## 2022-03-31 DIAGNOSIS — E0590 Thyrotoxicosis, unspecified without thyrotoxic crisis or storm: Secondary | ICD-10-CM | POA: Diagnosis not present

## 2022-03-31 DIAGNOSIS — K219 Gastro-esophageal reflux disease without esophagitis: Secondary | ICD-10-CM | POA: Diagnosis not present

## 2022-03-31 DIAGNOSIS — I10 Essential (primary) hypertension: Secondary | ICD-10-CM | POA: Diagnosis not present

## 2022-03-31 DIAGNOSIS — E669 Obesity, unspecified: Secondary | ICD-10-CM | POA: Diagnosis not present

## 2022-03-31 DIAGNOSIS — E1169 Type 2 diabetes mellitus with other specified complication: Secondary | ICD-10-CM | POA: Diagnosis not present

## 2022-03-31 DIAGNOSIS — E782 Mixed hyperlipidemia: Secondary | ICD-10-CM | POA: Diagnosis not present

## 2022-03-31 DIAGNOSIS — M8580 Other specified disorders of bone density and structure, unspecified site: Secondary | ICD-10-CM | POA: Diagnosis not present

## 2022-04-04 ENCOUNTER — Ambulatory Visit: Payer: Self-pay | Admitting: Physician Assistant

## 2022-05-26 ENCOUNTER — Encounter: Payer: Self-pay | Admitting: Cardiology

## 2022-05-26 MED ORDER — PRAVASTATIN SODIUM 40 MG PO TABS: 40.0000 mg | ORAL_TABLET | Freq: Every day | ORAL | 1 refills | Status: AC

## 2022-05-26 MED ORDER — EZETIMIBE 10 MG PO TABS: 10.0000 mg | ORAL_TABLET | Freq: Every day | ORAL | 1 refills | Status: AC

## 2022-05-30 DIAGNOSIS — C44622 Squamous cell carcinoma of skin of right upper limb, including shoulder: Secondary | ICD-10-CM | POA: Diagnosis not present

## 2022-05-30 DIAGNOSIS — D485 Neoplasm of uncertain behavior of skin: Secondary | ICD-10-CM | POA: Diagnosis not present

## 2022-07-17 ENCOUNTER — Ambulatory Visit
Admission: RE | Admit: 2022-07-17 | Discharge: 2022-07-17 | Disposition: A | Discharge status: Home / Self Care | Payer: 59 | Source: Ambulatory Visit | Attending: Family Medicine | Admitting: Family Medicine

## 2022-07-17 DIAGNOSIS — Z1231 Encounter for screening mammogram for malignant neoplasm of breast: Secondary | ICD-10-CM

## 2022-07-19 DIAGNOSIS — C44622 Squamous cell carcinoma of skin of right upper limb, including shoulder: Secondary | ICD-10-CM | POA: Diagnosis not present

## 2022-08-08 DIAGNOSIS — L814 Other melanin hyperpigmentation: Secondary | ICD-10-CM | POA: Diagnosis not present

## 2022-08-08 DIAGNOSIS — D1801 Hemangioma of skin and subcutaneous tissue: Secondary | ICD-10-CM | POA: Diagnosis not present

## 2022-08-08 DIAGNOSIS — L570 Actinic keratosis: Secondary | ICD-10-CM | POA: Diagnosis not present

## 2022-08-08 DIAGNOSIS — L821 Other seborrheic keratosis: Secondary | ICD-10-CM | POA: Diagnosis not present

## 2022-08-08 DIAGNOSIS — Z08 Encounter for follow-up examination after completed treatment for malignant neoplasm: Secondary | ICD-10-CM | POA: Diagnosis not present

## 2022-08-08 DIAGNOSIS — Z85828 Personal history of other malignant neoplasm of skin: Secondary | ICD-10-CM | POA: Diagnosis not present

## 2022-11-03 DIAGNOSIS — E0590 Thyrotoxicosis, unspecified without thyrotoxic crisis or storm: Secondary | ICD-10-CM | POA: Diagnosis not present

## 2022-11-03 DIAGNOSIS — E782 Mixed hyperlipidemia: Secondary | ICD-10-CM | POA: Diagnosis not present

## 2022-11-03 DIAGNOSIS — G4709 Other insomnia: Secondary | ICD-10-CM | POA: Diagnosis not present

## 2022-11-03 DIAGNOSIS — Z0000 Encounter for general adult medical examination without abnormal findings: Secondary | ICD-10-CM | POA: Diagnosis not present

## 2022-11-03 DIAGNOSIS — E669 Obesity, unspecified: Secondary | ICD-10-CM | POA: Diagnosis not present

## 2022-11-03 DIAGNOSIS — I10 Essential (primary) hypertension: Secondary | ICD-10-CM | POA: Diagnosis not present

## 2022-11-03 DIAGNOSIS — M8580 Other specified disorders of bone density and structure, unspecified site: Secondary | ICD-10-CM | POA: Diagnosis not present

## 2022-11-03 DIAGNOSIS — F419 Anxiety disorder, unspecified: Secondary | ICD-10-CM | POA: Diagnosis not present

## 2022-11-03 DIAGNOSIS — E1169 Type 2 diabetes mellitus with other specified complication: Secondary | ICD-10-CM | POA: Diagnosis not present

## 2022-11-06 ENCOUNTER — Other Ambulatory Visit: Payer: Self-pay | Admitting: Family Medicine

## 2022-11-06 DIAGNOSIS — M8588 Other specified disorders of bone density and structure, other site: Secondary | ICD-10-CM

## 2022-11-14 ENCOUNTER — Other Ambulatory Visit: Payer: Self-pay

## 2022-11-14 ENCOUNTER — Ambulatory Visit
Admission: EM | Admit: 2022-11-14 | Discharge: 2022-11-14 | Disposition: A | Discharge status: Home / Self Care | Payer: 59 | Attending: Physician Assistant | Admitting: Physician Assistant

## 2022-11-14 DIAGNOSIS — Z7984 Long term (current) use of oral hypoglycemic drugs: Secondary | ICD-10-CM

## 2022-11-14 DIAGNOSIS — E119 Type 2 diabetes mellitus without complications: Secondary | ICD-10-CM | POA: Diagnosis not present

## 2022-11-14 DIAGNOSIS — L237 Allergic contact dermatitis due to plants, except food: Secondary | ICD-10-CM | POA: Diagnosis not present

## 2022-11-14 MED ORDER — DEXAMETHASONE SODIUM PHOSPHATE 10 MG/ML IJ SOLN
10.0000 mg | Freq: Once | INTRAMUSCULAR | Status: AC
  Administered 2022-11-14: 10 mg via INTRAMUSCULAR

## 2022-11-14 NOTE — ED Provider Notes (Signed)
[name]    CSN: [id number] Arrival date & time: 11/14/22  0102      History   Chief Complaint Chief Complaint  Patient presents with   Rash    HPI Mallory Carter is a 67 y.o. female.   Patient here today for evaluation of rash to her arms, abdomen and other areas of her body that she is concerned may be poison ivy.  She states that she first noticed the rash a week ago and despite using multiple over-the-counter topical treatments has continued to have significant itching.  She denies any shortness of breath or trouble swallowing.  The history is provided by the patient.  Rash Associated symptoms: no fever, no nausea, no shortness of breath and not vomiting     Past Medical History:  Diagnosis Date   Abnormal Pap smear of cervix    late 1980's   Anemia    Anxiety    Arthritis    Cancer (HCC)    skin   Depression    Diabetes (HCC)    Dysmenorrhea    Genital warts    GERD (gastroesophageal reflux disease)    High cholesterol    Hx of fracture    Hypertension    Joint pain    Low vitamin D level    Sleep apnea    CPAP   Thyroid disease    no issues lately   Urinary incontinence    occ    Patient Active Problem List   Diagnosis Date Noted   Anxiety 08/18/2020   Body mass index (BMI) 32.0-32.9, adult 08/18/2020   Prediabetes 08/18/2020   Diverticulitis of colon 08/18/2020   Elevated transaminase level 08/18/2020   Family history of ischemic heart disease (IHD) 08/18/2020   Gastro-esophageal reflux disease without esophagitis 08/18/2020   Osteopenia 08/18/2020   Shoulder joint pain 08/18/2020   Type 2 diabetes mellitus with other specified complication (HCC) 08/18/2020   Type 2 diabetes mellitus without complication (HCC) 08/18/2020   Vitamin D deficiency 08/18/2020   Carpal tunnel syndrome of left wrist 03/31/2020   Ulnar neuropathy 03/31/2020   Pain in right knee 04/14/2019   Pain in left knee 02/25/2019   Intramural and subserous  leiomyoma of uterus 10/10/2017   Pain in right hand 05/07/2017   Tenosynovitis of right hand 05/07/2017   History of abnormal Pap smear 07/17/2012   HYPERLIPIDEMIA 11/04/2009   Essential hypertension 11/04/2009   Hypothyroidism 11/03/2009   OBSTRUCTIVE SLEEP APNEA 11/03/2009    Past Surgical History:  Procedure Laterality Date   APPENDECTOMY  1990   basal cell and melanoma removal     from skin    bone spur removal     BUNIONECTOMY     CARPAL TUNNEL RELEASE Right 2000s   CESAREAN SECTION  1984, 1988   COLON RESECTION     took 18 inches out   FOOT SURGERY     LASIK     ROTATOR CUFF REPAIR Right 2014   SHOULDER SURGERY  1990, 2000   calcium deposits   TONSILLECTOMY  1963   TUBAL LIGATION      OB History     Gravida  2   Para  2   Term  2   Preterm  0   AB  0   Living  2      SAB  0   IAB  0   Ectopic  0   Multiple      Live Births  2            Home Medications    Prior to Admission medications   Medication Sig Start Date End Date Taking? Authorizing Provider  Cholecalciferol (VITAMIN D) 2000 UNITS tablet Take 2,000 Units by mouth daily.   Yes [provider]  ezetimibe (ZETIA) 10 MG tablet Take 1 tablet (10 mg total) by mouth daily. 05/26/22  Yes Meriam Sprague, MD  lisinopril (PRINIVIL,ZESTRIL) 20 MG tablet Take 20 mg by mouth daily.   Yes [provider]  metFORMIN (GLUCOPHAGE-XR) 500 MG 24 hr tablet 500 mg 2 (two) times daily.   Yes [provider]  pravastatin (PRAVACHOL) 40 MG tablet Take 1 tablet (40 mg total) by mouth daily. 05/26/22  Yes Meriam Sprague, MD  predniSONE (DELTASONE) 10 MG tablet Take 10 mg by mouth 2 (two) times daily. 10/27/21  Yes [provider]  sertraline (ZOLOFT) 100 MG tablet Take 100 mg by mouth daily. 02/07/21  Yes [provider]    Family History Family History  Problem Relation Age of Onset   Diabetes Mother    Hypertension Mother    Heart Problems  Mother    Lung disease Mother    Cancer Father        skin   Hypertension Father    Diabetes Sister    Diabetes Brother    Hypertension Brother    Heart attack Brother    Heart disease Brother    Diabetes Maternal Grandmother    Diabetes Maternal Grandfather    Stroke Maternal Grandfather    Diabetes Brother    Breast cancer Neg Hx     Social History Social History   Tobacco Use   Smoking status: Never   Smokeless tobacco: Never  Vaping Use   Vaping status: Never Used  Substance Use Topics   Alcohol use: Yes    Comment: "some"   Drug use: No     Allergies   Patient has no known allergies.   Review of Systems Review of Systems  Constitutional:  Negative for chills and fever.  HENT:  Negative for trouble swallowing.   Eyes:  Negative for discharge and redness.  Respiratory:  Negative for shortness of breath.   Gastrointestinal:  Negative for nausea and vomiting.  Skin:  Positive for rash.     Physical Exam Triage Vital Signs ED Triage Vitals  Encounter Vitals Group     BP 11/14/22 0939 122/81     Systolic BP Percentile --      Diastolic BP Percentile --      Pulse Rate 11/14/22 0939 73     Resp 11/14/22 0939 18     Temp 11/14/22 0939 97.8 F (36.6 C)     Temp Source 11/14/22 0939 Oral     SpO2 11/14/22 0939 95 %     Weight 11/14/22 0937 181 lb (82.1 kg)     Height 11/14/22 0937 5\' 2"  (1.575 m)     Head Circumference --      Peak Flow --      Pain Score 11/14/22 0937 0     Pain Loc --      Pain Education --      Exclude from Growth Chart --    No data found.  Updated Vital Signs BP 122/81 (BP Location: Left Arm)   Pulse 73   Temp 97.8 F (36.6 C) (Oral)   Resp 18   Ht 5\' 2"  (1.575 m)   Wt  181 lb (82.1 kg)   LMP 07/17/2011   SpO2 95%   BMI 33.11 kg/m   Physical Exam Vitals and nursing note reviewed.  Constitutional:      General: She is not in acute distress.    Appearance: Normal appearance. She is not ill-appearing.  HENT:      Head: Normocephalic and atraumatic.  Eyes:     Conjunctiva/sclera: Conjunctivae normal.  Cardiovascular:     Rate and Rhythm: Normal rate.  Pulmonary:     Effort: Pulmonary effort is normal. No respiratory distress.  Skin:    Findings: Rash present.     Comments: Scattered erythematous papulovesicular lesions to bilateral arms and abdomen some in clusters and others in linear formation.  Neurological:     Mental Status: She is alert.  Psychiatric:        Mood and Affect: Mood normal.        Behavior: Behavior normal.        Thought Content: Thought content normal.      UC Treatments / Results  Labs (all labs ordered are listed, but only abnormal results are displayed) Labs Reviewed - No data to display  EKG   Radiology No results found.  Procedures Procedures (including critical care time)  Medications Ordered in UC Medications  dexamethasone (DECADRON) injection 10 mg (10 mg Intramuscular Given 11/14/22 1005)    Initial Impression / Assessment and Plan / UC Course  I have reviewed the triage vital signs and the nursing notes.  Pertinent labs & imaging results that were available during my care of the patient were reviewed by me and considered in my medical decision making (see chart for details).    Rash consistent with poison ivy.  Will treat with steroid injection.  Discussed that steroids may increase glucose levels and recommended she monitor closely.  Patient does note that diabetes has been well-controlled prior to today's visit.  Encouraged follow-up if symptoms fail to improve or with any further concerns.  Final Clinical Impressions(s) / UC Diagnoses   Final diagnoses:  Poison ivy dermatitis  Type 2 diabetes mellitus without complication, without long-term current use of insulin (HCC)     Discharge Instructions      Monitor glucose levels as it is possible they would be elevated after steroid injection.   Follow up with any further concerns.      ED Prescriptions   None    PDMP not reviewed this encounter.   Tomi Bamberger, PA-C 11/14/22 1056

## 2022-11-14 NOTE — ED Triage Notes (Signed)
"  Rash/Poison Ivy exposure on various areas of body, red, itchy, not improving". First noticed "last Tuesday".

## 2022-11-14 NOTE — Discharge Instructions (Signed)
Monitor glucose levels as it is possible they would be elevated after steroid injection.   Follow up with any further concerns.

## 2022-11-17 ENCOUNTER — Telehealth: Payer: Self-pay

## 2022-11-17 MED ORDER — PREDNISONE 20 MG PO TABS: 40.0000 mg | ORAL_TABLET | Freq: Every day | ORAL | 0 refills | Status: AC

## 2022-11-17 NOTE — Telephone Encounter (Signed)
Prednisone burst prescribed

## 2022-11-17 NOTE — Telephone Encounter (Signed)
Patient called stating that she "still has the poison ivy/oak" with some improvement since last visit on 11-14-2022 after in-office injection.   Discussed with provider, Rx (Prednisone) to be sent & advised to use Hydrocortisone 1% PRN for topical use.  Confirmed pharmacy: [facility]: [id number] [place]  Call/Message forwarded to provider after discussion and advisement of above with patient, B. Roten CMA

## 2022-12-08 DIAGNOSIS — E1169 Type 2 diabetes mellitus with other specified complication: Secondary | ICD-10-CM | POA: Diagnosis not present

## 2022-12-08 DIAGNOSIS — L237 Allergic contact dermatitis due to plants, except food: Secondary | ICD-10-CM | POA: Diagnosis not present

## 2022-12-30 ENCOUNTER — Ambulatory Visit
Admission: RE | Admit: 2022-12-30 | Discharge: 2022-12-30 | Disposition: A | Discharge status: Home / Self Care | Payer: Medicare Other | Source: Ambulatory Visit

## 2022-12-30 VITALS — BP 118/70 | HR 75 | Temp 98.0°F | Resp 18 | Ht 63.0 in | Wt 187.0 lb

## 2022-12-30 DIAGNOSIS — J0110 Acute frontal sinusitis, unspecified: Secondary | ICD-10-CM

## 2022-12-30 MED ORDER — AMOXICILLIN-POT CLAVULANATE 875-125 MG PO TABS: 1.0000 | ORAL_TABLET | Freq: Two times a day (BID) | ORAL | 0 refills | Status: AC

## 2022-12-30 NOTE — ED Triage Notes (Signed)
Been around grandson and first week of daycare, bad cough sounds deep Did home Covid test negative. Started last Sunday. - Entered by patient.  Other concerns "I have a tremendous headache, my neck and back is stiff from coughing a lot". No chest pain. No sob.

## 2023-01-04 NOTE — ED Provider Notes (Signed)
[name]    CSN: [id number] Arrival date & time: 12/30/22  0900      History   Chief Complaint Chief Complaint  Patient presents with   Cough    Been around grandson and first week of daycare, bad cough sounds deepDid home Covid test negative.Started last Sunday. - Entered by patient    HPI Mallory Carter is a 67 y.o. female.   Patient here today for evaluation of cough that sounds deep.  She notes that symptoms started about 5 to 6 days ago.  She has had frontal headache, sinus pressure as well as neck and back pain.  She denies any chest pain or shortness of breath.  She does not report fever.  The history is provided by the patient.  Cough Associated symptoms: no chills, no ear pain, no eye discharge, no fever, no shortness of breath, no sore throat and no wheezing     Past Medical History:  Diagnosis Date   Abnormal Pap smear of cervix    late 1980's   Anemia    Anxiety    Arthritis    Cancer (HCC)    skin   Depression    Diabetes (HCC)    Dysmenorrhea    Genital warts    GERD (gastroesophageal reflux disease)    High cholesterol    Hx of fracture    Hypertension    Joint pain    Low vitamin D level    Sleep apnea    CPAP   Thyroid disease    no issues lately   Urinary incontinence    occ    Patient Active Problem List   Diagnosis Date Noted   Anxiety 08/18/2020   Body mass index (BMI) 32.0-32.9, adult 08/18/2020   Prediabetes 08/18/2020   Diverticulitis of colon 08/18/2020   Elevated transaminase level 08/18/2020   Family history of ischemic heart disease (IHD) 08/18/2020   Gastro-esophageal reflux disease without esophagitis 08/18/2020   Osteopenia 08/18/2020   Shoulder joint pain 08/18/2020   Type 2 diabetes mellitus with other specified complication (HCC) 08/18/2020   Type 2 diabetes mellitus without complication (HCC) 08/18/2020   Vitamin D deficiency 08/18/2020   Carpal tunnel syndrome of left wrist 03/31/2020   Ulnar  neuropathy 03/31/2020   Pain in right knee 04/14/2019   Pain in left knee 02/25/2019   Intramural and subserous leiomyoma of uterus 10/10/2017   Pain in right hand 05/07/2017   Tenosynovitis of right hand 05/07/2017   History of abnormal Pap smear 07/17/2012   HYPERLIPIDEMIA 11/04/2009   Essential hypertension 11/04/2009   Hypothyroidism 11/03/2009   OBSTRUCTIVE SLEEP APNEA 11/03/2009    Past Surgical History:  Procedure Laterality Date   APPENDECTOMY  1990   basal cell and melanoma removal     from skin    bone spur removal     BUNIONECTOMY     CARPAL TUNNEL RELEASE Right 2000s   CESAREAN SECTION  1984, 1988   COLON RESECTION     took 18 inches out   FOOT SURGERY     LASIK     ROTATOR CUFF REPAIR Right 2014   SHOULDER SURGERY  1990, 2000   calcium deposits   TONSILLECTOMY  1963   TUBAL LIGATION      OB History     Gravida  2   Para  2   Term  2   Preterm  0   AB  0   Living  2  SAB  0   IAB  0   Ectopic  0   Multiple      Live Births  2            Home Medications    Prior to Admission medications   Medication Sig Start Date End Date Taking? Authorizing Provider  amoxicillin-clavulanate (AUGMENTIN) 875-125 MG tablet Take 1 tablet by mouth every 12 (twelve) hours. 12/30/22  Yes Tomi Bamberger, PA-C  Cholecalciferol (VITAMIN D) 2000 UNITS tablet Take 2,000 Units by mouth daily.   Yes [provider]  ezetimibe (ZETIA) 10 MG tablet Take 1 tablet (10 mg total) by mouth daily. 05/26/22  Yes Meriam Sprague, MD  lisinopril (PRINIVIL,ZESTRIL) 20 MG tablet Take 20 mg by mouth daily.   Yes [provider]  metFORMIN (GLUCOPHAGE-XR) 500 MG 24 hr tablet 500 mg 2 (two) times daily.   Yes [provider]  pravastatin (PRAVACHOL) 40 MG tablet Take 1 tablet (40 mg total) by mouth daily. 05/26/22  Yes Meriam Sprague, MD  sertraline (ZOLOFT) 100 MG tablet Take 100 mg by mouth daily. 02/07/21  Yes [provider]  UNABLE TO FIND Med Name: B-12 Oral and Vitamin C Oral.   Yes [provider]    Family History Family History  Problem Relation Age of Onset   Diabetes Mother    Hypertension Mother    Heart Problems Mother    Lung disease Mother    Cancer Father        skin   Hypertension Father    Diabetes Sister    Diabetes Brother    Hypertension Brother    Heart attack Brother    Heart disease Brother    Diabetes Maternal Grandmother    Diabetes Maternal Grandfather    Stroke Maternal Grandfather    Diabetes Brother    Breast cancer Neg Hx     Social History Social History   Tobacco Use   Smoking status: Never   Smokeless tobacco: Never  Vaping Use   Vaping status: Never Used  Substance Use Topics   Alcohol use: Yes    Comment: "some"   Drug use: No     Allergies   Patient has no known allergies.   Review of Systems Review of Systems  Constitutional:  Negative for chills and fever.  HENT:  Positive for congestion and sinus pressure. Negative for ear pain and sore throat.   Eyes:  Negative for discharge and redness.  Respiratory:  Positive for cough. Negative for shortness of breath and wheezing.   Gastrointestinal:  Negative for abdominal pain, diarrhea, nausea and vomiting.     Physical Exam Triage Vital Signs ED Triage Vitals  Encounter Vitals Group     BP 12/30/22 0942 118/70     Systolic BP Percentile --      Diastolic BP Percentile --      Pulse Rate 12/30/22 0942 75     Resp 12/30/22 0942 18     Temp 12/30/22 0942 98 F (36.7 C)     Temp Source 12/30/22 0942 Oral     SpO2 12/30/22 0942 97 %     Weight 12/30/22 0939 187 lb (84.8 kg)     Height 12/30/22 0939 5\' 3"  (1.6 m)     Head Circumference --      Peak Flow --      Pain Score 12/30/22 0937 10     Pain Loc --      Pain  Education --      Exclude from [employer] Chart --    No data found.  Updated Vital Signs BP 118/70 (BP Location: Right Arm)   Pulse 75   Temp 98 F (36.7 C)  (Oral)   Resp 18   Ht 5\' 3"  (1.6 m)   Wt 187 lb (84.8 kg)   LMP 07/17/2011   SpO2 97%   BMI 33.13 kg/m      Physical Exam Vitals and nursing note reviewed.  Constitutional:      General: She is not in acute distress.    Appearance: Normal appearance. She is not ill-appearing.  HENT:     Head: Normocephalic and atraumatic.     Nose: Congestion present.     Mouth/Throat:     Mouth: Mucous membranes are moist.     Pharynx: No oropharyngeal exudate or posterior oropharyngeal erythema.  Eyes:     Conjunctiva/sclera: Conjunctivae normal.  Cardiovascular:     Rate and Rhythm: Normal rate and regular rhythm.     Heart sounds: Normal heart sounds. No murmur heard. Pulmonary:     Effort: Pulmonary effort is normal. No respiratory distress.     Breath sounds: Normal breath sounds. No wheezing, rhonchi or rales.  Skin:    General: Skin is warm and dry.  Neurological:     Mental Status: She is alert.  Psychiatric:        Mood and Affect: Mood normal.        Thought Content: Thought content normal.      UC Treatments / Results  Labs (all labs ordered are listed, but only abnormal results are displayed) Labs Reviewed - No data to display  EKG   Radiology No results found.  Procedures Procedures (including critical care time)  Medications Ordered in UC Medications - No data to display  Initial Impression / Assessment and Plan / UC Course  I have reviewed the triage vital signs and the nursing notes.  Pertinent labs & imaging results that were available during my care of the patient were reviewed by me and considered in my medical decision making (see chart for details).    Will treat to cover sinusitis with Augmentin.  Encouraged follow-up if no gradual improvement or with any further concerns.  Final Clinical Impressions(s) / UC Diagnoses   Final diagnoses:  Acute frontal sinusitis, recurrence not specified   Discharge Instructions   None    ED Prescriptions      Medication Sig Dispense Auth. Provider   amoxicillin-clavulanate (AUGMENTIN) 875-125 MG tablet Take 1 tablet by mouth every 12 (twelve) hours. 14 tablet Tomi Bamberger, PA-C      PDMP not reviewed this encounter.   Tomi Bamberger, PA-C 01/04/23 2053

## 2023-03-22 DIAGNOSIS — D2261 Melanocytic nevi of right upper limb, including shoulder: Secondary | ICD-10-CM | POA: Diagnosis not present

## 2023-03-22 DIAGNOSIS — D485 Neoplasm of uncertain behavior of skin: Secondary | ICD-10-CM | POA: Diagnosis not present

## 2023-03-22 DIAGNOSIS — D2262 Melanocytic nevi of left upper limb, including shoulder: Secondary | ICD-10-CM | POA: Diagnosis not present

## 2023-03-22 DIAGNOSIS — R208 Other disturbances of skin sensation: Secondary | ICD-10-CM | POA: Diagnosis not present

## 2023-03-22 DIAGNOSIS — D2272 Melanocytic nevi of left lower limb, including hip: Secondary | ICD-10-CM | POA: Diagnosis not present

## 2023-03-22 DIAGNOSIS — Z86006 Personal history of melanoma in-situ: Secondary | ICD-10-CM | POA: Diagnosis not present

## 2023-03-22 DIAGNOSIS — X32XXXA Exposure to sunlight, initial encounter: Secondary | ICD-10-CM | POA: Diagnosis not present

## 2023-03-22 DIAGNOSIS — Z85828 Personal history of other malignant neoplasm of skin: Secondary | ICD-10-CM | POA: Diagnosis not present

## 2023-03-22 DIAGNOSIS — L570 Actinic keratosis: Secondary | ICD-10-CM | POA: Diagnosis not present

## 2023-03-22 DIAGNOSIS — D225 Melanocytic nevi of trunk: Secondary | ICD-10-CM | POA: Diagnosis not present

## 2023-04-05 DIAGNOSIS — D045 Carcinoma in situ of skin of trunk: Secondary | ICD-10-CM | POA: Diagnosis not present

## 2023-04-05 DIAGNOSIS — C44622 Squamous cell carcinoma of skin of right upper limb, including shoulder: Secondary | ICD-10-CM | POA: Diagnosis not present

## 2023-04-06 DIAGNOSIS — E559 Vitamin D deficiency, unspecified: Secondary | ICD-10-CM | POA: Diagnosis not present

## 2023-04-06 DIAGNOSIS — G4709 Other insomnia: Secondary | ICD-10-CM | POA: Diagnosis not present

## 2023-04-06 DIAGNOSIS — E119 Type 2 diabetes mellitus without complications: Secondary | ICD-10-CM | POA: Diagnosis not present

## 2023-04-06 DIAGNOSIS — E0590 Thyrotoxicosis, unspecified without thyrotoxic crisis or storm: Secondary | ICD-10-CM | POA: Diagnosis not present

## 2023-04-06 DIAGNOSIS — M8580 Other specified disorders of bone density and structure, unspecified site: Secondary | ICD-10-CM | POA: Diagnosis not present

## 2023-04-06 DIAGNOSIS — E782 Mixed hyperlipidemia: Secondary | ICD-10-CM | POA: Diagnosis not present

## 2023-04-06 DIAGNOSIS — E1169 Type 2 diabetes mellitus with other specified complication: Secondary | ICD-10-CM | POA: Diagnosis not present

## 2023-04-06 DIAGNOSIS — I10 Essential (primary) hypertension: Secondary | ICD-10-CM | POA: Diagnosis not present

## 2023-04-19 DIAGNOSIS — Z48817 Encounter for surgical aftercare following surgery on the skin and subcutaneous tissue: Secondary | ICD-10-CM | POA: Diagnosis not present

## 2023-04-19 DIAGNOSIS — J40 Bronchitis, not specified as acute or chronic: Secondary | ICD-10-CM | POA: Diagnosis not present

## 2023-04-19 DIAGNOSIS — E785 Hyperlipidemia, unspecified: Secondary | ICD-10-CM | POA: Diagnosis not present

## 2023-04-19 DIAGNOSIS — E119 Type 2 diabetes mellitus without complications: Secondary | ICD-10-CM | POA: Diagnosis not present

## 2023-04-21 DIAGNOSIS — Z85828 Personal history of other malignant neoplasm of skin: Secondary | ICD-10-CM | POA: Diagnosis not present

## 2023-04-21 DIAGNOSIS — E119 Type 2 diabetes mellitus without complications: Secondary | ICD-10-CM | POA: Diagnosis not present

## 2023-04-21 DIAGNOSIS — S41111D Laceration without foreign body of right upper arm, subsequent encounter: Secondary | ICD-10-CM | POA: Diagnosis not present

## 2023-07-17 ENCOUNTER — Ambulatory Visit
Admission: RE | Admit: 2023-07-17 | Discharge: 2023-07-17 | Disposition: A | Discharge status: Home / Self Care | Payer: 59 | Source: Ambulatory Visit | Attending: Family Medicine | Admitting: Family Medicine

## 2023-07-17 DIAGNOSIS — M8588 Other specified disorders of bone density and structure, other site: Secondary | ICD-10-CM | POA: Diagnosis not present

## 2023-07-18 DIAGNOSIS — M25562 Pain in left knee: Secondary | ICD-10-CM | POA: Diagnosis not present

## 2023-08-08 DIAGNOSIS — Z860101 Personal history of adenomatous and serrated colon polyps: Secondary | ICD-10-CM | POA: Diagnosis not present

## 2023-08-08 DIAGNOSIS — Z980 Intestinal bypass and anastomosis status: Secondary | ICD-10-CM | POA: Diagnosis not present

## 2023-08-08 DIAGNOSIS — K5730 Diverticulosis of large intestine without perforation or abscess without bleeding: Secondary | ICD-10-CM | POA: Diagnosis not present

## 2023-08-08 DIAGNOSIS — D123 Benign neoplasm of transverse colon: Secondary | ICD-10-CM | POA: Diagnosis not present

## 2023-08-08 DIAGNOSIS — Z09 Encounter for follow-up examination after completed treatment for conditions other than malignant neoplasm: Secondary | ICD-10-CM | POA: Diagnosis not present

## 2023-09-12 ENCOUNTER — Other Ambulatory Visit: Payer: Self-pay | Admitting: Family Medicine

## 2023-09-12 DIAGNOSIS — Z1231 Encounter for screening mammogram for malignant neoplasm of breast: Secondary | ICD-10-CM

## 2023-09-14 ENCOUNTER — Ambulatory Visit
Admission: RE | Admit: 2023-09-14 | Discharge: 2023-09-14 | Disposition: A | Discharge status: Home / Self Care | Source: Ambulatory Visit | Attending: Family Medicine | Admitting: Family Medicine

## 2023-09-14 DIAGNOSIS — Z1231 Encounter for screening mammogram for malignant neoplasm of breast: Secondary | ICD-10-CM | POA: Diagnosis not present

## 2023-11-08 DIAGNOSIS — E559 Vitamin D deficiency, unspecified: Secondary | ICD-10-CM | POA: Diagnosis not present

## 2023-11-08 DIAGNOSIS — E782 Mixed hyperlipidemia: Secondary | ICD-10-CM | POA: Diagnosis not present

## 2023-11-08 DIAGNOSIS — M8580 Other specified disorders of bone density and structure, unspecified site: Secondary | ICD-10-CM | POA: Diagnosis not present

## 2023-11-08 DIAGNOSIS — I10 Essential (primary) hypertension: Secondary | ICD-10-CM | POA: Diagnosis not present

## 2023-11-08 DIAGNOSIS — E1169 Type 2 diabetes mellitus with other specified complication: Secondary | ICD-10-CM | POA: Diagnosis not present

## 2023-11-08 DIAGNOSIS — Z0000 Encounter for general adult medical examination without abnormal findings: Secondary | ICD-10-CM | POA: Diagnosis not present

## 2023-11-08 DIAGNOSIS — E0590 Thyrotoxicosis, unspecified without thyrotoxic crisis or storm: Secondary | ICD-10-CM | POA: Diagnosis not present

## 2023-11-08 DIAGNOSIS — G4709 Other insomnia: Secondary | ICD-10-CM | POA: Diagnosis not present

## 2023-12-06 DIAGNOSIS — D2272 Melanocytic nevi of left lower limb, including hip: Secondary | ICD-10-CM | POA: Diagnosis not present

## 2023-12-06 DIAGNOSIS — L570 Actinic keratosis: Secondary | ICD-10-CM | POA: Diagnosis not present

## 2023-12-06 DIAGNOSIS — L821 Other seborrheic keratosis: Secondary | ICD-10-CM | POA: Diagnosis not present

## 2023-12-06 DIAGNOSIS — D2271 Melanocytic nevi of right lower limb, including hip: Secondary | ICD-10-CM | POA: Diagnosis not present

## 2023-12-06 DIAGNOSIS — Z08 Encounter for follow-up examination after completed treatment for malignant neoplasm: Secondary | ICD-10-CM | POA: Diagnosis not present

## 2023-12-06 DIAGNOSIS — D225 Melanocytic nevi of trunk: Secondary | ICD-10-CM | POA: Diagnosis not present

## 2023-12-06 DIAGNOSIS — Z86006 Personal history of melanoma in-situ: Secondary | ICD-10-CM | POA: Diagnosis not present

## 2023-12-06 DIAGNOSIS — D2262 Melanocytic nevi of left upper limb, including shoulder: Secondary | ICD-10-CM | POA: Diagnosis not present

## 2023-12-06 DIAGNOSIS — Z85828 Personal history of other malignant neoplasm of skin: Secondary | ICD-10-CM | POA: Diagnosis not present

## 2023-12-06 DIAGNOSIS — C4441 Basal cell carcinoma of skin of scalp and neck: Secondary | ICD-10-CM | POA: Diagnosis not present

## 2023-12-06 DIAGNOSIS — D2261 Melanocytic nevi of right upper limb, including shoulder: Secondary | ICD-10-CM | POA: Diagnosis not present

## 2023-12-06 DIAGNOSIS — D485 Neoplasm of uncertain behavior of skin: Secondary | ICD-10-CM | POA: Diagnosis not present

## 2023-12-06 DIAGNOSIS — D044 Carcinoma in situ of skin of scalp and neck: Secondary | ICD-10-CM | POA: Diagnosis not present

## 2023-12-06 DIAGNOSIS — D0461 Carcinoma in situ of skin of right upper limb, including shoulder: Secondary | ICD-10-CM | POA: Diagnosis not present

## 2023-12-27 DIAGNOSIS — D0461 Carcinoma in situ of skin of right upper limb, including shoulder: Secondary | ICD-10-CM | POA: Diagnosis not present

## 2024-01-10 DIAGNOSIS — C4441 Basal cell carcinoma of skin of scalp and neck: Secondary | ICD-10-CM | POA: Diagnosis not present

## 2024-01-10 DIAGNOSIS — D044 Carcinoma in situ of skin of scalp and neck: Secondary | ICD-10-CM | POA: Diagnosis not present
